# Patient Record
Sex: Female | Born: 1950
Health system: Southern US, Community
[De-identification: ages and names within clinical notes are randomized; demographics above are authoritative.]

## PROBLEM LIST (undated history)

## (undated) DIAGNOSIS — I10 Essential (primary) hypertension: Secondary | ICD-10-CM

## (undated) DIAGNOSIS — C7490 Malignant neoplasm of unspecified part of unspecified adrenal gland: Secondary | ICD-10-CM

## (undated) DIAGNOSIS — C801 Malignant (primary) neoplasm, unspecified: Secondary | ICD-10-CM

## (undated) HISTORY — PX: CERVICAL POLYPECTOMY: SHX88

## (undated) HISTORY — PX: ADRENAL GLAND SURGERY: SHX544

## (undated) HISTORY — PX: KNEE ARTHROSCOPY: SHX127

## (undated) HISTORY — PX: DILATION AND CURETTAGE OF UTERUS: SHX78

---

## 1999-01-03 ENCOUNTER — Other Ambulatory Visit: Admission: RE | Admit: 1999-01-03 | Discharge: 1999-01-03 | Payer: Self-pay | Admitting: Obstetrics & Gynecology

## 1999-04-10 ENCOUNTER — Encounter: Payer: Self-pay | Admitting: Obstetrics & Gynecology

## 1999-04-10 ENCOUNTER — Encounter: Admission: RE | Admit: 1999-04-10 | Discharge: 1999-04-10 | Payer: Self-pay | Admitting: Obstetrics & Gynecology

## 2000-01-10 ENCOUNTER — Other Ambulatory Visit: Admission: RE | Admit: 2000-01-10 | Discharge: 2000-01-10 | Payer: Self-pay | Admitting: Obstetrics & Gynecology

## 2000-04-10 ENCOUNTER — Encounter: Admission: RE | Admit: 2000-04-10 | Discharge: 2000-04-10 | Payer: Self-pay | Admitting: Obstetrics & Gynecology

## 2000-04-10 ENCOUNTER — Encounter: Payer: Self-pay | Admitting: Obstetrics & Gynecology

## 2001-02-06 ENCOUNTER — Other Ambulatory Visit: Admission: RE | Admit: 2001-02-06 | Discharge: 2001-02-06 | Payer: Self-pay | Admitting: Obstetrics & Gynecology

## 2001-02-11 ENCOUNTER — Encounter: Admission: RE | Admit: 2001-02-11 | Discharge: 2001-02-11 | Payer: Self-pay | Admitting: Family Medicine

## 2001-02-11 ENCOUNTER — Encounter: Payer: Self-pay | Admitting: Family Medicine

## 2001-02-12 ENCOUNTER — Encounter: Payer: Self-pay | Admitting: Family Medicine

## 2001-02-12 ENCOUNTER — Encounter: Admission: RE | Admit: 2001-02-12 | Discharge: 2001-02-12 | Payer: Self-pay | Admitting: Family Medicine

## 2001-02-20 ENCOUNTER — Encounter: Payer: Self-pay | Admitting: General Surgery

## 2001-02-20 ENCOUNTER — Ambulatory Visit (HOSPITAL_COMMUNITY): Admission: RE | Admit: 2001-02-20 | Discharge: 2001-02-20 | Payer: Self-pay | Admitting: General Surgery

## 2001-03-20 ENCOUNTER — Encounter: Payer: Self-pay | Admitting: General Surgery

## 2001-03-25 ENCOUNTER — Encounter: Payer: Self-pay | Admitting: General Surgery

## 2001-03-25 ENCOUNTER — Encounter (INDEPENDENT_AMBULATORY_CARE_PROVIDER_SITE_OTHER): Payer: Self-pay | Admitting: *Deleted

## 2001-03-25 ENCOUNTER — Inpatient Hospital Stay (HOSPITAL_COMMUNITY): Admission: RE | Admit: 2001-03-25 | Discharge: 2001-03-31 | Payer: Self-pay | Admitting: General Surgery

## 2001-04-08 ENCOUNTER — Encounter: Payer: Self-pay | Admitting: Oncology

## 2001-04-08 ENCOUNTER — Encounter: Admission: RE | Admit: 2001-04-08 | Discharge: 2001-04-08 | Payer: Self-pay | Admitting: Oncology

## 2001-05-27 ENCOUNTER — Encounter: Payer: Self-pay | Admitting: Obstetrics & Gynecology

## 2001-05-27 ENCOUNTER — Encounter: Admission: RE | Admit: 2001-05-27 | Discharge: 2001-05-27 | Payer: Self-pay | Admitting: Obstetrics & Gynecology

## 2001-06-24 ENCOUNTER — Encounter: Admission: RE | Admit: 2001-06-24 | Discharge: 2001-06-24 | Payer: Self-pay | Admitting: Oncology

## 2001-06-24 ENCOUNTER — Encounter: Payer: Self-pay | Admitting: Oncology

## 2001-09-28 ENCOUNTER — Encounter: Admission: RE | Admit: 2001-09-28 | Discharge: 2001-09-28 | Payer: Self-pay | Admitting: Oncology

## 2001-09-28 ENCOUNTER — Encounter: Payer: Self-pay | Admitting: Oncology

## 2001-12-21 ENCOUNTER — Encounter: Payer: Self-pay | Admitting: Oncology

## 2001-12-21 ENCOUNTER — Encounter: Admission: RE | Admit: 2001-12-21 | Discharge: 2001-12-21 | Payer: Self-pay | Admitting: Oncology

## 2002-02-09 ENCOUNTER — Other Ambulatory Visit: Admission: RE | Admit: 2002-02-09 | Discharge: 2002-02-09 | Payer: Self-pay | Admitting: Obstetrics & Gynecology

## 2002-04-27 ENCOUNTER — Encounter: Admission: RE | Admit: 2002-04-27 | Discharge: 2002-04-27 | Payer: Self-pay | Admitting: Oncology

## 2002-04-27 ENCOUNTER — Encounter: Payer: Self-pay | Admitting: Oncology

## 2002-05-31 ENCOUNTER — Ambulatory Visit (HOSPITAL_COMMUNITY): Admission: RE | Admit: 2002-05-31 | Discharge: 2002-05-31 | Payer: Self-pay | Admitting: Obstetrics & Gynecology

## 2002-05-31 ENCOUNTER — Encounter: Payer: Self-pay | Admitting: Obstetrics & Gynecology

## 2003-01-04 ENCOUNTER — Encounter: Admission: RE | Admit: 2003-01-04 | Discharge: 2003-01-04 | Payer: Self-pay | Admitting: Oncology

## 2003-02-17 ENCOUNTER — Other Ambulatory Visit: Admission: RE | Admit: 2003-02-17 | Discharge: 2003-02-17 | Payer: Self-pay | Admitting: Obstetrics & Gynecology

## 2003-06-01 ENCOUNTER — Ambulatory Visit (HOSPITAL_COMMUNITY): Admission: RE | Admit: 2003-06-01 | Discharge: 2003-06-01 | Payer: Self-pay | Admitting: Obstetrics & Gynecology

## 2003-09-09 ENCOUNTER — Ambulatory Visit (HOSPITAL_COMMUNITY): Admission: RE | Admit: 2003-09-09 | Discharge: 2003-09-09 | Payer: Self-pay | Admitting: Gastroenterology

## 2003-09-28 ENCOUNTER — Encounter: Admission: RE | Admit: 2003-09-28 | Discharge: 2003-09-28 | Payer: Self-pay | Admitting: Oncology

## 2004-02-14 ENCOUNTER — Ambulatory Visit: Payer: Self-pay | Admitting: Oncology

## 2004-02-20 ENCOUNTER — Other Ambulatory Visit: Admission: RE | Admit: 2004-02-20 | Discharge: 2004-02-20 | Payer: Self-pay | Admitting: Obstetrics & Gynecology

## 2004-06-06 ENCOUNTER — Ambulatory Visit (HOSPITAL_COMMUNITY): Admission: RE | Admit: 2004-06-06 | Discharge: 2004-06-06 | Payer: Self-pay | Admitting: Obstetrics & Gynecology

## 2004-10-05 ENCOUNTER — Encounter: Admission: RE | Admit: 2004-10-05 | Discharge: 2004-10-05 | Payer: Self-pay | Admitting: Family Medicine

## 2005-02-22 ENCOUNTER — Other Ambulatory Visit: Admission: RE | Admit: 2005-02-22 | Discharge: 2005-02-22 | Payer: Self-pay | Admitting: Obstetrics & Gynecology

## 2005-06-07 ENCOUNTER — Ambulatory Visit (HOSPITAL_COMMUNITY): Admission: RE | Admit: 2005-06-07 | Discharge: 2005-06-07 | Payer: Self-pay | Admitting: Obstetrics & Gynecology

## 2005-10-07 ENCOUNTER — Encounter: Admission: RE | Admit: 2005-10-07 | Discharge: 2005-10-07 | Payer: Self-pay | Admitting: Family Medicine

## 2006-06-09 ENCOUNTER — Ambulatory Visit (HOSPITAL_COMMUNITY): Admission: RE | Admit: 2006-06-09 | Discharge: 2006-06-09 | Payer: Self-pay | Admitting: *Deleted

## 2007-06-10 ENCOUNTER — Ambulatory Visit (HOSPITAL_COMMUNITY): Admission: RE | Admit: 2007-06-10 | Discharge: 2007-06-10 | Payer: Self-pay | Admitting: Family Medicine

## 2007-10-01 ENCOUNTER — Ambulatory Visit (HOSPITAL_COMMUNITY): Admission: RE | Admit: 2007-10-01 | Discharge: 2007-10-01 | Payer: Self-pay | Admitting: Family Medicine

## 2008-06-10 ENCOUNTER — Ambulatory Visit (HOSPITAL_COMMUNITY): Admission: RE | Admit: 2008-06-10 | Discharge: 2008-06-10 | Payer: Self-pay | Admitting: Obstetrics & Gynecology

## 2008-10-05 ENCOUNTER — Ambulatory Visit (HOSPITAL_COMMUNITY): Admission: RE | Admit: 2008-10-05 | Discharge: 2008-10-05 | Payer: Self-pay | Admitting: Family Medicine

## 2008-12-20 ENCOUNTER — Ambulatory Visit (HOSPITAL_COMMUNITY): Admission: RE | Admit: 2008-12-20 | Discharge: 2008-12-20 | Payer: Self-pay | Admitting: Gastroenterology

## 2009-06-12 ENCOUNTER — Ambulatory Visit (HOSPITAL_COMMUNITY): Admission: RE | Admit: 2009-06-12 | Discharge: 2009-06-12 | Payer: Self-pay | Admitting: Family Medicine

## 2009-09-05 ENCOUNTER — Encounter: Admission: RE | Admit: 2009-09-05 | Discharge: 2009-09-05 | Payer: Self-pay | Admitting: Family Medicine

## 2009-10-26 ENCOUNTER — Ambulatory Visit (HOSPITAL_COMMUNITY): Admission: RE | Admit: 2009-10-26 | Discharge: 2009-10-26 | Payer: Self-pay | Admitting: Specialist

## 2009-10-31 ENCOUNTER — Encounter
Admission: RE | Admit: 2009-10-31 | Discharge: 2009-11-23 | Payer: Self-pay | Source: Home / Self Care | Admitting: Specialist

## 2010-04-05 LAB — APTT: aPTT: 31 seconds (ref 24–37)

## 2010-04-05 LAB — COMPREHENSIVE METABOLIC PANEL
ALT: 19 U/L (ref 0–35)
Calcium: 10.3 mg/dL (ref 8.4–10.5)
Creatinine, Ser: 0.82 mg/dL (ref 0.4–1.2)
Glucose, Bld: 108 mg/dL — ABNORMAL HIGH (ref 70–99)
Sodium: 139 mEq/L (ref 135–145)
Total Protein: 7.1 g/dL (ref 6.0–8.3)

## 2010-04-05 LAB — URINE MICROSCOPIC-ADD ON

## 2010-04-05 LAB — CBC
HCT: 40.5 % (ref 36.0–46.0)
MCHC: 33.8 g/dL (ref 30.0–36.0)
RDW: 12.9 % (ref 11.5–15.5)

## 2010-04-05 LAB — URINALYSIS, ROUTINE W REFLEX MICROSCOPIC
Nitrite: NEGATIVE
Protein, ur: NEGATIVE mg/dL
Specific Gravity, Urine: 1.009 (ref 1.005–1.030)
Urobilinogen, UA: 0.2 mg/dL (ref 0.0–1.0)

## 2010-04-05 LAB — DIFFERENTIAL
Eosinophils Absolute: 0.1 10*3/uL (ref 0.0–0.7)
Lymphocytes Relative: 20 % (ref 12–46)
Lymphs Abs: 1.2 10*3/uL (ref 0.7–4.0)
Monocytes Relative: 8 % (ref 3–12)
Neutro Abs: 4.4 10*3/uL (ref 1.7–7.7)
Neutrophils Relative %: 71 % (ref 43–77)

## 2010-04-05 LAB — PROTIME-INR: INR: 0.97 (ref 0.00–1.49)

## 2010-05-10 ENCOUNTER — Other Ambulatory Visit (HOSPITAL_COMMUNITY): Payer: Self-pay | Admitting: Obstetrics & Gynecology

## 2010-05-10 DIAGNOSIS — Z1231 Encounter for screening mammogram for malignant neoplasm of breast: Secondary | ICD-10-CM

## 2010-06-08 NOTE — Op Note (Signed)
[place]. [facility]  Patient:    Robin Wells, Robin Wells Visit Number: [id number] MRN: [id number]          Service Type: SUR Location: MICU 2104 01 Attending Physician:  Brandy Hale Dictated by:   Angelia Mould. Derrell Lolling, M.D. Proc. Date: 03/25/01 Admit Date:  03/25/2001   CC:         [name]. [name], M.D.  [name]. [name], M.D.   Operative Report  PREOPERATIVE DIAGNOSIS:  Left adrenal mass, suspect adrenocortical carcinoma.  POSTOPERATIVE DIAGNOSIS:  Left adrenal mass, suspect adrenocortical carcinoma.  PROCEDURES: 1. Left adrenalectomy. 2. Repair left renal vein. 2. Take down splenic flexure of colon.  SURGEON:  Angelia Mould. Derrell Lolling, M.D.  FIRST ASSISTANT:  Velora Heckler, M.D.  ESTIMATED BLOOD LOSS:  About 1300-1500 cc.  FLUID REPLACEMENT:  One unit of packed red blood cells and 4000 cc of IV fluids.  OPERATIVE INDICATION:  This is a 60 year old white female who recently had an ultrasound, CT, and MRI which revealed a 15 cm mass in the left adrenal gland. There was concern that there may be tumor thrombus in the left adrenal vein. She has had some increased facial hair and abdominal hair which has turned darker over the past eight months.  She otherwise feels well.  Workup reveals that the tumor is secreting 17-ketosteroids and DHEA.  Workup for pheochromocytoma was negative.  She has mild hypertension, which has come under control with the use of Altace and hydrochlorothiazide.  She has been advised that this is most likely an adrenocortical carcinoma.  She is brought to the operating room electively for left adrenalectomy.  OPERATIVE FINDINGS:  The patient had a large tumor completely replacing her left adrenal gland.  This was at least 15 cm in size and perhaps larger.  It had numerous venous channels and a venous plexus throughout the surface of the gland, but this did not appear to be invading the diaphragm or the pancreas or the  spleen or the colon or the kidney.  There was no sign of any significant adenopathy.  There was no sign of any metastasis to the omentum or liver.  The tumor itself appeared to fill the left adrenal vein all the way down to the insertion of the left adrenal vein onto the left renal vein, which necessitated a complex vascular repair of the left renal vein and a venorrhaphy.  The left kidney was not involved.  There was a good tissue plane between the tumor and the left kidney, and we did not have to remove the left kidney.  DESCRIPTION OF PROCEDURE:  Following the induction of general endotracheal anesthesia, a Foley catheter was inserted.  An arterial line and a central venous catheter were inserted by anesthesia.  The abdomen was prepped and draped in a sterile fashion.  Bilateral subcostal incision was made, mostly on the left.  The abdominal wall was traversed with electrocautery and entered and explored with findings as described above.  A self-retaining retractor was placed.  We divided the gastrocolic omentum and mobilized the splenic flexure of the colon inferiorly and medially.  We identified the lower border of the pancreas.  We mobilized the spleen by dividing its lateral peritoneal attachments and then slowly with blunt dissection mobilized the spleen and the tail of the pancreas off the tumor.  The splenic vein was patent.  There was a good free plane between the posterior wall of the pancreas and the splenic vein and the tumor  itself, and we mobilized this far over to the right.  We then mobilized the left lobe of the liver by dividing the triangular ligament and then packed everything off to the right.  This then gave [place] exposure to the large left adrenal mass.  We started by dividing soft tissue between the tumor and the upper pole of the kidney and then mobilized the tumor laterally and superiorly and then superomedially.  Larger veins were controlled with metal clips or  2-0 silk ties.  We spent a great deal of time doing this.  This was a very difficult case because of the size of the tumor and the number of blood vessels we had to control, and it took [place] almost 3-1/2 hours to do the case because of the complexity of this work.  We continued to mobilize the tumor laterally and superiorly until we had it completely free from the diaphragm.  We could identify the aorta.  We identified some small arterial vessels coming from the aorta to the tumor, clipped these with metal clips and divided these.  As we continued to mobilize the tumor, we found one accessory adrenal vein, which was doubly ligated and divided.  We initially thought this was the adrenal vein but as we continued our dissection down toward the hilum of the kidney, we identified a tongue of the tumor, which we ultimately found was going right to the superior surface of the left renal vein.  After further isolating this and dissecting this, we felt that this was tumor completely filling the left adrenal vein.  We took this dissection all the way down to the renal vein, and the tumor stopped just above the renal vein.  We placed a Satinsky clamp as a side bite on the superior part of the left renal vein.  We transected the left adrenal vein at this point below the tumor and removed the specimen.  The left renal vein was then closed with a running locking suture of 4-0 Prolene.  The Satinsky clamp was removed and hemostasis was excellent, and the renal vein was patent and not bleeding.  We then irrigated the left upper quadrant copiously.  A few other small bleeders were controlled with 2-0 silk ties and Surgicel gauze, but ultimately we had a very dry field.  The left kidney looked fine, good pulsations in the left renal artery.  The aorta looked fine.  The diaphragm was inspected and found to be intact.  No injury to the diaphragm could be detected.  We removed  all of our packs.  We then  inspected the stomach and the pancreas and the spleen and the colon, and they all looked perfectly fine.  There was no apparent injury or laceration or bleeding to any of these organs.  We placed two 19 French round Jackson-Pratt drains in the left upper quadrant and brought these out through separate stab incisions in the left flank, sutured the drains to the skin with nylon sutures, and connected these to suction bulbs.  The posterior rectus sheath on each side of the midline was closed with running sutures of #1 PDS.  The anterior rectus sheath on each side of the midline was closed with running sutures of #1 PDS.  The skin was closed with skin staples.  Clean bandages were placed and the patient taken to the recovery room in stable condition.  She remained stable throughout the case with good urine output. Dictated by:   Angelia Mould. Derrell Lolling, M.D. Attending  Physician:  Brandy Hale DD:  03/25/01 TD:  03/26/01 Job: [id number]

## 2010-06-08 NOTE — Procedures (Signed)
[place]. [facility]  Patient:    Robin Wells, PE Visit Number: [id number] MRN: [id number]          Service Type: SUR Location: MICU 2104 01 Attending Physician:  Brandy Hale Dictated by:   Cliffton Asters Ivin Booty, M.D. Admit Date:  03/25/2001                             Procedure Report  PROCEDURE:  Insertion of epidural catheter for postoperative analgesia.  DESCRIPTION OF PROCEDURE:  Ms. Mousseau was brought to the operating room by [facility]. Derrell Lolling, M.D., for excision of an adrenal mass.  Prior to the operation I spoke with her concerning the risks and benefits of epidural analgesia, including those of infection, subarachnoid or intravenous injection, and nerve damage.  She understood these and wished to have the procedure performed.  At conclusion of the abdominal procedure, she was turned to the right lateral position.  Her back was prepped in a sterile fashion.  At the T9-10 interspace a 17-gauge Tuohy needle was passed to a loss of resistance to air, 3 cc. There was a negative aspiration of blood or CSF, and an 18-gauge epidural catheter was passed through the needle to a depth of 4 cm below the end of the needle.  The needle was removed, and the catheter was secured.  There was a negative aspiration of blood or CSF in the catheter, and a bolus injection of 14 cc of 0.5% lidocaine containing 75 mcg of fentanyl was given.  The patient was turned supine, suctioned, and extubated uneventfully in the operating room.  She was taken to the PACU in good condition.  She will be started there on a constant infusion through the catheter of 1/16% bupivacaine containing 5 mcg/cc of fentanyl.  This will be followed closely by the anesthesia department for several days until removal of the catheter. Dictated by:   Cliffton Asters Ivin Booty, M.D. Attending Physician:  Brandy Hale DD:  03/25/01 TD:  03/26/01 Job: [id number]

## 2010-06-08 NOTE — Discharge Summary (Signed)
[place]. [facility]  Patient:    Robin Wells, Robin Wells Visit Number: [id number] MRN: [id number]          Service Type: SUR Location: [id number] Attending Physician:  Robin Wells Dictated by:   Angelia Wells. Robin Wells, M.D. Admit Date:  03/25/2001 Discharge Date: 03/31/2001   CC:         Robin Wells. Robin Wells, M.D.  Robin Wells. Robin Wells, M.D.  Robin Wells, M.D.   Discharge Summary  FINAL DIAGNOSES: 1. Cortical carcinoma of the left adrenal gland. 2. Hypertension. 3. Hyperlipidemia.  OPERATION:  Left adrenalectomy and repair of left renal vein with takedown of splenic flexure and colon on March 25, 2001.  HISTORY:  This is a 60 year old white female who was found to have a large left adrenal mass by Dr. Henrine Wells.  Ultrasound and CT scan and MRI were ultimately done.  These showed a large tumor at least 11 cm in diameter in the left upper quadrant on top of the left kidney, displacing the left kidney inferiorly.  The splenic vein was noted to be patent.  The left renal vein was noted to be patent as was the inferior vena cava.  There were no signs of any metastatic disease or other tumor within the abdomen.  The MRI measured the mass as 15 cm in diameter, and it was felt to be most likely an adrenal cortical carcinoma.  Endocrine workup revealed that the tumor was secreting 17 ketosteroids and DHEA at a high level.  The urine free cortisol was 8.6 which is slightly low, suggesting the possibility of mild suppression of the contralateral adrenal. Metanephrines, normetanephrines, and VMA were normal, and so we did not feel this was a pheochromocytoma.  Blood pressure was controlled preoperatively with Altace and hydrochlorothiazide.  Elective left adrenalectomy was advised, and she was brought to the hospital for that electively.  For details of Past Medical History, Social History, Family History, please see detailed admission  note.  PHYSICAL EXAMINATION:  GENERAL:  A pleasant, mildly anxious, slightly overweight white female in no distress.  HEENT:  Sclerae clear.  NECK:  Without mass.  LUNGS:  Clear.  HEART:  Regular rate and rhythm, no murmur.  ABDOMEN:  Obese, soft, nontender.  No palpable mass, no bruit.  GROIN:  No femoral pulses, no inguinal adenopathy.  EXTREMITIES:  No edema, good pulses.  SKIN:  It was noted that she did have some hirsutism with black hair on the face and abdominal wall which had been known for the past year.  HOSPITAL COURSE:  On the day of admission, the patient was taken to the operating room and underwent exploratory laparotomy, left adrenalectomy, and repair of the left renal vein.  This was a large circumscribed tumor in the left upper quadrant requiring complete mobilization of the spleen, pancreas, left colon, splenic flexure, and stomach all the way past the midline.  The tumor appeared to fill the left adrenal vein all the way down close to the renal vein, and so the renal vein had to be controlled with a Satinsky clamp, amputating the tumor with this, and the renal vein was suture closed with a running suture of Prolene.  There was no sign of any other pathologic process.  The patient felt no intraoperative complications.  Postoperatively, the patient did well clinically.  She progressed slowly but steadily in her diet and activities.  We left a Jackson-Pratt drain in place until she was eating.  The drain  was then removed.  She was maintained on Decadron which was rapidly tapered.  Her pathology report shows adrenal cortical carcinoma, 16 cm in diameter, margins negative.  No lymph nodes were identified.  The patient was advised of her diagnosis and its implications.  She was advised to be seen by a medical oncologist, and that is going to be arranged as an outpatient.  She is discharged on March 31, 2001.  At that time, she was tolerating diet, had had  bowel movements, was ambulatory.  He wounds were doing well, and she felt well enough to go home.  She was given a prescription for prednisone 10 mg a day with a rapid taper over the next 12 days.  She was given a prescription for Vicodin.  She was to continue hydrochlorothiazide and Altace.  She was advised to return to see me in the office in six to eight days.  She was advised to return to see Robin Wells or Dr. Henrine Wells for followup on her hypertension.  She was scheduled to see Dr. Jama Wells as an outpatient for medical oncology consultation.  Dictated by:   Angelia Wells. Robin Wells, M.D. Attending Physician:  Robin Wells DD:  04/17/01 TD:  04/18/01 Job: [phone] [id number]

## 2010-06-14 ENCOUNTER — Ambulatory Visit (HOSPITAL_COMMUNITY)
Admission: RE | Admit: 2010-06-14 | Discharge: 2010-06-14 | Disposition: A | Discharge status: Home / Self Care | Payer: 59 | Source: Ambulatory Visit | Attending: Obstetrics & Gynecology | Admitting: Obstetrics & Gynecology

## 2010-06-14 DIAGNOSIS — Z1231 Encounter for screening mammogram for malignant neoplasm of breast: Secondary | ICD-10-CM | POA: Insufficient documentation

## 2011-04-24 ENCOUNTER — Other Ambulatory Visit: Payer: Self-pay | Admitting: Obstetrics & Gynecology

## 2011-04-24 DIAGNOSIS — Z1231 Encounter for screening mammogram for malignant neoplasm of breast: Secondary | ICD-10-CM

## 2011-06-18 ENCOUNTER — Other Ambulatory Visit (HOSPITAL_COMMUNITY): Payer: Self-pay | Admitting: Family Medicine

## 2011-06-18 ENCOUNTER — Ambulatory Visit (HOSPITAL_COMMUNITY)
Admission: RE | Admit: 2011-06-18 | Discharge: 2011-06-18 | Disposition: A | Discharge status: Home / Self Care | Payer: 59 | Source: Ambulatory Visit | Attending: Obstetrics & Gynecology | Admitting: Obstetrics & Gynecology

## 2011-06-18 DIAGNOSIS — Z1231 Encounter for screening mammogram for malignant neoplasm of breast: Secondary | ICD-10-CM | POA: Insufficient documentation

## 2012-03-19 ENCOUNTER — Other Ambulatory Visit: Payer: Self-pay | Admitting: Obstetrics & Gynecology

## 2012-04-24 ENCOUNTER — Other Ambulatory Visit: Payer: Self-pay | Admitting: Obstetrics & Gynecology

## 2012-04-24 DIAGNOSIS — Z1231 Encounter for screening mammogram for malignant neoplasm of breast: Secondary | ICD-10-CM

## 2012-06-24 ENCOUNTER — Ambulatory Visit (HOSPITAL_COMMUNITY)
Admission: RE | Admit: 2012-06-24 | Discharge: 2012-06-24 | Disposition: A | Discharge status: Home / Self Care | Payer: 59 | Source: Ambulatory Visit | Attending: Obstetrics & Gynecology | Admitting: Obstetrics & Gynecology

## 2012-06-24 DIAGNOSIS — Z1231 Encounter for screening mammogram for malignant neoplasm of breast: Secondary | ICD-10-CM | POA: Insufficient documentation

## 2013-05-28 ENCOUNTER — Other Ambulatory Visit (HOSPITAL_COMMUNITY): Payer: Self-pay | Admitting: Obstetrics & Gynecology

## 2013-05-28 DIAGNOSIS — Z1231 Encounter for screening mammogram for malignant neoplasm of breast: Secondary | ICD-10-CM

## 2013-07-01 ENCOUNTER — Ambulatory Visit (HOSPITAL_COMMUNITY)
Admission: RE | Admit: 2013-07-01 | Discharge: 2013-07-01 | Disposition: A | Discharge status: Home / Self Care | Payer: 59 | Source: Ambulatory Visit | Attending: Obstetrics & Gynecology | Admitting: Obstetrics & Gynecology

## 2013-07-01 DIAGNOSIS — Z1231 Encounter for screening mammogram for malignant neoplasm of breast: Secondary | ICD-10-CM | POA: Insufficient documentation

## 2014-06-30 ENCOUNTER — Other Ambulatory Visit: Payer: Self-pay | Admitting: Obstetrics & Gynecology

## 2014-07-01 LAB — CYTOLOGY - PAP

## 2014-08-02 ENCOUNTER — Other Ambulatory Visit (HOSPITAL_COMMUNITY): Payer: Self-pay | Admitting: Obstetrics & Gynecology

## 2014-08-02 ENCOUNTER — Ambulatory Visit (HOSPITAL_COMMUNITY)
Admission: RE | Admit: 2014-08-02 | Discharge: 2014-08-02 | Disposition: A | Discharge status: Home / Self Care | Payer: 59 | Source: Ambulatory Visit | Attending: Obstetrics & Gynecology | Admitting: Obstetrics & Gynecology

## 2014-08-02 DIAGNOSIS — Z1231 Encounter for screening mammogram for malignant neoplasm of breast: Secondary | ICD-10-CM

## 2014-08-31 ENCOUNTER — Encounter (HOSPITAL_COMMUNITY): Payer: Self-pay

## 2014-08-31 ENCOUNTER — Other Ambulatory Visit: Payer: Self-pay

## 2014-08-31 ENCOUNTER — Encounter (HOSPITAL_COMMUNITY)
Admission: RE | Admit: 2014-08-31 | Discharge: 2014-08-31 | Disposition: A | Discharge status: Home / Self Care | Payer: 59 | Source: Ambulatory Visit | Attending: Obstetrics and Gynecology | Admitting: Obstetrics and Gynecology

## 2014-08-31 DIAGNOSIS — Z01810 Encounter for preprocedural cardiovascular examination: Secondary | ICD-10-CM | POA: Diagnosis not present

## 2014-08-31 DIAGNOSIS — Z01818 Encounter for other preprocedural examination: Secondary | ICD-10-CM | POA: Insufficient documentation

## 2014-08-31 DIAGNOSIS — I10 Essential (primary) hypertension: Secondary | ICD-10-CM | POA: Insufficient documentation

## 2014-08-31 HISTORY — DX: Essential (primary) hypertension: I10

## 2014-08-31 HISTORY — DX: Malignant (primary) neoplasm, unspecified: C80.1

## 2014-08-31 LAB — COMPREHENSIVE METABOLIC PANEL
ALT: 16 U/L (ref 14–54)
AST: 18 U/L (ref 15–41)
Albumin: 4.4 g/dL (ref 3.5–5.0)
Alkaline Phosphatase: 71 U/L (ref 38–126)
Anion gap: 5 (ref 5–15)
BUN: 16 mg/dL (ref 6–20)
CALCIUM: 10.4 mg/dL — AB (ref 8.9–10.3)
CO2: 30 mmol/L (ref 22–32)
CREATININE: 0.89 mg/dL (ref 0.44–1.00)
Chloride: 104 mmol/L (ref 101–111)
GFR calc non Af Amer: >60 mL/min (ref >60)
Glucose, Bld: 107 mg/dL — ABNORMAL HIGH (ref 65–99)
Potassium: 4 mmol/L (ref 3.5–5.1)
SODIUM: 139 mmol/L (ref 135–145)
Total Bilirubin: 0.7 mg/dL (ref 0.3–1.2)
Total Protein: 7.1 g/dL (ref 6.5–8.1)

## 2014-08-31 LAB — ABO/RH: ABO/RH(D): AB NEG

## 2014-08-31 LAB — TYPE AND SCREEN
ABO/RH(D): AB NEG
Antibody Screen: NEGATIVE

## 2014-08-31 LAB — CBC
HCT: 37.6 % (ref 36.0–46.0)
Hemoglobin: 12.8 g/dL (ref 12.0–15.0)
MCH: 30.3 pg (ref 26.0–34.0)
MCHC: 34 g/dL (ref 30.0–36.0)
MCV: 89.1 fL (ref 78.0–100.0)
Platelets: 189 10*3/uL (ref 150–400)
RBC: 4.22 MIL/uL (ref 3.87–5.11)
RDW: 12.9 % (ref 11.5–15.5)
WBC: 6.7 10*3/uL (ref 4.0–10.5)

## 2014-08-31 NOTE — Anesthesia Preprocedure Evaluation (Addendum)
Anesthesia Evaluation  Patient identified by MRN, date of birth, ID band Patient awake    Reviewed: Allergy & Precautions, H&P , Patient's Chart, lab work & pertinent test results, reviewed documented beta blocker date and time   Airway Mallampati: II TM Distance: >3 FB Neck ROM: full    Dental no notable dental hx.    Pulmonary  breath sounds clear to auscultation  Pulmonary exam normal       Cardiovascular hypertension, Rhythm:regular Rate:Normal     Neuro/Psych    GI/Hepatic   Endo/Other    Renal/GU      Musculoskeletal   Abdominal   Peds  Hematology   Anesthesia Other Findings   Reproductive/Obstetrics                           Anesthesia Physical Anesthesia Plan  ASA: II  Anesthesia Plan: General   Post-op Pain Management:    Induction: Intravenous  Airway Management Planned: Oral ETT  Additional Equipment:   Intra-op Plan:   Post-operative Plan: Extubation in OR  Informed Consent: I have reviewed the patients History and Physical, chart, labs and discussed the procedure including the risks, benefits and alternatives for the proposed anesthesia with the patient or authorized representative who has indicated his/her understanding and acceptance.   Dental Advisory Given and Dental advisory given  Plan Discussed with: CRNA and Surgeon  Anesthesia Plan Comments: (  Discussed general anesthesia, including possible nausea, instrumentation of airway, sore throat,pulmonary aspiration, etc. I asked if the were any outstanding questions, or  concerns before we proceeded. )        Anesthesia Quick Evaluation  

## 2014-08-31 NOTE — Patient Instructions (Signed)
Your procedure is scheduled on:09/08/14  Enter through the Main Entrance at :6am Pick up desk phone and dial [phone] and inform us of your arrival.  Please call [phone] if you have any problems the morning of surgery.  Remember: Do not eat food or drink liquids, including water, after midnight:WED    You may brush your teeth the morning of surgery.  DO NOT wear jewelry, eye make-up, lipstick,body lotion, or dark fingernail polish.  (Polished toes are ok) You may wear deodorant.  If you are to be admitted after surgery, leave suitcase in car until your room has been assigned. Patients discharged on the day of surgery will not be allowed to drive home. Wear loose fitting, comfortable clothes for your ride home.

## 2014-09-08 ENCOUNTER — Inpatient Hospital Stay (HOSPITAL_COMMUNITY)
Admission: AD | Admit: 2014-09-08 | Discharge: 2014-09-09 | DRG: 743 | Disposition: A | Discharge status: Home / Self Care | Payer: 59 | Source: Ambulatory Visit | Attending: Obstetrics and Gynecology | Admitting: Obstetrics and Gynecology

## 2014-09-08 ENCOUNTER — Ambulatory Visit (HOSPITAL_COMMUNITY): Payer: 59 | Admitting: Anesthesiology

## 2014-09-08 ENCOUNTER — Encounter (HOSPITAL_COMMUNITY)
Admission: AD | Disposition: A | Discharge status: Home / Self Care | Payer: Self-pay | Source: Ambulatory Visit | Attending: Obstetrics and Gynecology

## 2014-09-08 DIAGNOSIS — N813 Complete uterovaginal prolapse: Secondary | ICD-10-CM | POA: Diagnosis present

## 2014-09-08 DIAGNOSIS — Z882 Allergy status to sulfonamides status: Secondary | ICD-10-CM | POA: Diagnosis not present

## 2014-09-08 DIAGNOSIS — Z8589 Personal history of malignant neoplasm of other organs and systems: Secondary | ICD-10-CM

## 2014-09-08 DIAGNOSIS — Z79899 Other long term (current) drug therapy: Secondary | ICD-10-CM | POA: Diagnosis not present

## 2014-09-08 DIAGNOSIS — Z881 Allergy status to other antibiotic agents status: Secondary | ICD-10-CM

## 2014-09-08 DIAGNOSIS — I10 Essential (primary) hypertension: Secondary | ICD-10-CM | POA: Diagnosis present

## 2014-09-08 DIAGNOSIS — Z880 Allergy status to penicillin: Secondary | ICD-10-CM

## 2014-09-08 DIAGNOSIS — N393 Stress incontinence (female) (male): Secondary | ICD-10-CM | POA: Diagnosis present

## 2014-09-08 HISTORY — PX: CYSTOSCOPY: SHX5120

## 2014-09-08 HISTORY — PX: ANTERIOR AND POSTERIOR REPAIR WITH SACROSPINOUS FIXATION: SHX6536

## 2014-09-08 HISTORY — PX: PUBOVAGINAL SLING: SHX1035

## 2014-09-08 HISTORY — PX: LAPAROSCOPIC ASSISTED VAGINAL HYSTERECTOMY: SHX5398

## 2014-09-08 HISTORY — PX: SALPINGOOPHORECTOMY: SHX82

## 2014-09-08 SURGERY — HYSTERECTOMY, VAGINAL, LAPAROSCOPY-ASSISTED
Anesthesia: General

## 2014-09-08 MED ORDER — MAGNESIUM HYDROXIDE 400 MG/5ML PO SUSP: 30.0000 mL | Freq: Every day | ORAL | Status: DC | PRN

## 2014-09-08 MED ORDER — LISINOPRIL 10 MG PO TABS
10.0000 mg | ORAL_TABLET | Freq: Every day | ORAL | Status: DC
  Administered 2014-09-08: 10 mg via ORAL
  Filled 2014-09-08: qty 1

## 2014-09-08 MED ORDER — OXYCODONE-ACETAMINOPHEN 5-325 MG PO TABS: 1.0000 | ORAL_TABLET | ORAL | Status: DC | PRN

## 2014-09-08 MED ORDER — SODIUM CHLORIDE 0.9 % IJ SOLN
INTRAMUSCULAR | Status: DC | PRN
  Administered 2014-09-08: 10 mL via INTRAVENOUS

## 2014-09-08 MED ORDER — NALOXONE HCL 0.4 MG/ML IJ SOLN: 0.4000 mg | INTRAMUSCULAR | Status: DC | PRN

## 2014-09-08 MED ORDER — DIPHENHYDRAMINE HCL 50 MG/ML IJ SOLN: 12.5000 mg | Freq: Four times a day (QID) | INTRAMUSCULAR | Status: DC | PRN

## 2014-09-08 MED ORDER — SODIUM CHLORIDE 0.9 % IJ SOLN: 9.0000 mL | INTRAMUSCULAR | Status: DC | PRN

## 2014-09-08 MED ORDER — SCOPOLAMINE 1 MG/3DAYS TD PT72
MEDICATED_PATCH | TRANSDERMAL | Status: AC
  Administered 2014-09-08: 1.5 mg via TRANSDERMAL
  Filled 2014-09-08: qty 1

## 2014-09-08 MED ORDER — HYDROMORPHONE HCL 1 MG/ML IJ SOLN
INTRAMUSCULAR | Status: AC
  Filled 2014-09-08: qty 1

## 2014-09-08 MED ORDER — ONDANSETRON HCL 4 MG/2ML IJ SOLN
INTRAMUSCULAR | Status: AC
  Filled 2014-09-08: qty 2

## 2014-09-08 MED ORDER — FLUORESCEIN SODIUM 10 % IJ SOLN
INTRAMUSCULAR | Status: DC | PRN
  Administered 2014-09-08: .1 mL via INTRAVENOUS

## 2014-09-08 MED ORDER — LACTATED RINGERS IV SOLN
INTRAVENOUS | Status: DC
  Administered 2014-09-08 (×3): via INTRAVENOUS

## 2014-09-08 MED ORDER — ONDANSETRON HCL 4 MG/2ML IJ SOLN
INTRAMUSCULAR | Status: DC | PRN
  Administered 2014-09-08: 4 mg via INTRAVENOUS

## 2014-09-08 MED ORDER — LIDOCAINE HCL (CARDIAC) 20 MG/ML IV SOLN
INTRAVENOUS | Status: AC
  Filled 2014-09-08: qty 5

## 2014-09-08 MED ORDER — MIDAZOLAM HCL 2 MG/2ML IJ SOLN
INTRAMUSCULAR | Status: AC
  Filled 2014-09-08: qty 4

## 2014-09-08 MED ORDER — IBUPROFEN 600 MG PO TABS
600.0000 mg | ORAL_TABLET | Freq: Four times a day (QID) | ORAL | Status: DC | PRN
  Administered 2014-09-08 – 2014-09-09 (×2): 600 mg via ORAL
  Filled 2014-09-08 (×2): qty 1

## 2014-09-08 MED ORDER — DEXAMETHASONE SODIUM PHOSPHATE 10 MG/ML IJ SOLN
INTRAMUSCULAR | Status: AC
  Filled 2014-09-08: qty 1

## 2014-09-08 MED ORDER — FLUORESCEIN SODIUM 10 % IJ SOLN
500.0000 mg | Freq: Once | INTRAMUSCULAR | Status: DC
  Filled 2014-09-08: qty 5

## 2014-09-08 MED ORDER — LIDOCAINE HCL (CARDIAC) 20 MG/ML IV SOLN
INTRAVENOUS | Status: DC | PRN
  Administered 2014-09-08: 100 mg via INTRAVENOUS

## 2014-09-08 MED ORDER — LORATADINE 10 MG PO TABS
10.0000 mg | ORAL_TABLET | Freq: Every day | ORAL | Status: DC
  Filled 2014-09-08 (×2): qty 1

## 2014-09-08 MED ORDER — CEFAZOLIN SODIUM-DEXTROSE 2-3 GM-% IV SOLR
2.0000 g | INTRAVENOUS | Status: AC
  Administered 2014-09-08: 2 g via INTRAVENOUS

## 2014-09-08 MED ORDER — DEXAMETHASONE SODIUM PHOSPHATE 10 MG/ML IJ SOLN
INTRAMUSCULAR | Status: DC | PRN
  Administered 2014-09-08 (×2): 4 mg via INTRAVENOUS

## 2014-09-08 MED ORDER — ESTRADIOL 0.1 MG/GM VA CREA
TOPICAL_CREAM | VAGINAL | Status: DC | PRN
  Administered 2014-09-08: 1 via VAGINAL

## 2014-09-08 MED ORDER — SIMETHICONE 80 MG PO CHEW: 80.0000 mg | CHEWABLE_TABLET | Freq: Four times a day (QID) | ORAL | Status: DC | PRN

## 2014-09-08 MED ORDER — DEXAMETHASONE SODIUM PHOSPHATE 4 MG/ML IJ SOLN
INTRAMUSCULAR | Status: AC
  Filled 2014-09-08: qty 1

## 2014-09-08 MED ORDER — PROPOFOL 10 MG/ML IV BOLUS
INTRAVENOUS | Status: DC | PRN
  Administered 2014-09-08: 160 mg via INTRAVENOUS

## 2014-09-08 MED ORDER — HYDROMORPHONE 0.3 MG/ML IV SOLN: INTRAVENOUS | Status: DC

## 2014-09-08 MED ORDER — STERILE WATER FOR IRRIGATION IR SOLN
Status: DC | PRN
  Administered 2014-09-08: 1000 mL

## 2014-09-08 MED ORDER — HEPARIN SODIUM (PORCINE) 5000 UNIT/ML IJ SOLN
INTRAMUSCULAR | Status: AC
  Filled 2014-09-08: qty 1

## 2014-09-08 MED ORDER — NEOSTIGMINE METHYLSULFATE 10 MG/10ML IV SOLN
INTRAVENOUS | Status: DC | PRN
  Administered 2014-09-08: 3 mg via INTRAVENOUS

## 2014-09-08 MED ORDER — ONDANSETRON HCL 4 MG/2ML IJ SOLN: 4.0000 mg | Freq: Four times a day (QID) | INTRAMUSCULAR | Status: DC | PRN

## 2014-09-08 MED ORDER — LIDOCAINE-EPINEPHRINE 0.5 %-1:200000 IJ SOLN
INTRAMUSCULAR | Status: DC | PRN
  Administered 2014-09-08: 8 mL

## 2014-09-08 MED ORDER — SODIUM CHLORIDE 0.9 % IJ SOLN
INTRAMUSCULAR | Status: AC
  Filled 2014-09-08: qty 10

## 2014-09-08 MED ORDER — DIPHENHYDRAMINE HCL 12.5 MG/5ML PO ELIX
12.5000 mg | ORAL_SOLUTION | Freq: Four times a day (QID) | ORAL | Status: DC | PRN
  Filled 2014-09-08: qty 5

## 2014-09-08 MED ORDER — LISINOPRIL 10 MG PO TABS
10.0000 mg | ORAL_TABLET | Freq: Every day | ORAL | Status: DC
  Filled 2014-09-08 (×2): qty 1

## 2014-09-08 MED ORDER — LACTATED RINGERS IR SOLN
Status: DC | PRN
  Administered 2014-09-08: 3000 mL

## 2014-09-08 MED ORDER — KETOROLAC TROMETHAMINE 30 MG/ML IJ SOLN
INTRAMUSCULAR | Status: AC
  Filled 2014-09-08: qty 1

## 2014-09-08 MED ORDER — CEFAZOLIN SODIUM-DEXTROSE 2-3 GM-% IV SOLR
INTRAVENOUS | Status: AC
  Filled 2014-09-08: qty 50

## 2014-09-08 MED ORDER — PROPOFOL 10 MG/ML IV BOLUS
INTRAVENOUS | Status: AC
  Filled 2014-09-08: qty 20

## 2014-09-08 MED ORDER — FENTANYL CITRATE (PF) 100 MCG/2ML IJ SOLN: 25.0000 ug | INTRAMUSCULAR | Status: DC | PRN

## 2014-09-08 MED ORDER — GLYCOPYRROLATE 0.2 MG/ML IJ SOLN
INTRAMUSCULAR | Status: DC | PRN
  Administered 2014-09-08: 0.6 mg via INTRAVENOUS

## 2014-09-08 MED ORDER — ROCURONIUM BROMIDE 100 MG/10ML IV SOLN
INTRAVENOUS | Status: DC | PRN
  Administered 2014-09-08: 40 mg via INTRAVENOUS
  Administered 2014-09-08: 10 mg via INTRAVENOUS
  Administered 2014-09-08: 5 mg via INTRAVENOUS
  Administered 2014-09-08 (×2): 10 mg via INTRAVENOUS

## 2014-09-08 MED ORDER — ZOLPIDEM TARTRATE 5 MG PO TABS: 5.0000 mg | ORAL_TABLET | Freq: Every evening | ORAL | Status: DC | PRN

## 2014-09-08 MED ORDER — MIDAZOLAM HCL 5 MG/5ML IJ SOLN
INTRAMUSCULAR | Status: DC | PRN
  Administered 2014-09-08: 2 mg via INTRAVENOUS

## 2014-09-08 MED ORDER — NEOSTIGMINE METHYLSULFATE 10 MG/10ML IV SOLN
INTRAVENOUS | Status: AC
  Filled 2014-09-08: qty 1

## 2014-09-08 MED ORDER — BUPIVACAINE HCL (PF) 0.5 % IJ SOLN
INTRAMUSCULAR | Status: AC
  Filled 2014-09-08: qty 30

## 2014-09-08 MED ORDER — FENTANYL CITRATE (PF) 100 MCG/2ML IJ SOLN
INTRAMUSCULAR | Status: DC | PRN
  Administered 2014-09-08: 50 ug via INTRAVENOUS
  Administered 2014-09-08: 100 ug via INTRAVENOUS
  Administered 2014-09-08 (×2): 50 ug via INTRAVENOUS

## 2014-09-08 MED ORDER — ROCURONIUM BROMIDE 100 MG/10ML IV SOLN
INTRAVENOUS | Status: AC
  Filled 2014-09-08: qty 1

## 2014-09-08 MED ORDER — SENNA 8.6 MG PO TABS
1.0000 | ORAL_TABLET | Freq: Two times a day (BID) | ORAL | Status: DC
  Administered 2014-09-08 – 2014-09-09 (×2): 8.6 mg via ORAL
  Filled 2014-09-08 (×3): qty 1

## 2014-09-08 MED ORDER — LACTATED RINGERS IV SOLN
INTRAVENOUS | Status: DC
  Administered 2014-09-08 (×2): via INTRAVENOUS

## 2014-09-08 MED ORDER — BUPIVACAINE HCL (PF) 0.5 % IJ SOLN
INTRAMUSCULAR | Status: DC | PRN
  Administered 2014-09-08: 6 mL

## 2014-09-08 MED ORDER — LIDOCAINE-EPINEPHRINE 0.5 %-1:200000 IJ SOLN
INTRAMUSCULAR | Status: AC
  Filled 2014-09-08: qty 1

## 2014-09-08 MED ORDER — ACETAMINOPHEN 160 MG/5ML PO SOLN
650.0000 mg | Freq: Once | ORAL | Status: AC
  Administered 2014-09-08: 650 mg via ORAL

## 2014-09-08 MED ORDER — ACETAMINOPHEN 160 MG/5ML PO SOLN
ORAL | Status: AC
  Administered 2014-09-08: 650 mg via ORAL
  Filled 2014-09-08: qty 40.6

## 2014-09-08 MED ORDER — DEXAMETHASONE SODIUM PHOSPHATE 4 MG/ML IJ SOLN
INTRAMUSCULAR | Status: AC
  Filled 2014-09-08: qty 2

## 2014-09-08 MED ORDER — SODIUM CHLORIDE 0.9 % IJ SOLN
INTRAMUSCULAR | Status: AC
  Filled 2014-09-08: qty 100

## 2014-09-08 MED ORDER — FENTANYL CITRATE (PF) 250 MCG/5ML IJ SOLN
INTRAMUSCULAR | Status: AC
  Filled 2014-09-08: qty 25

## 2014-09-08 MED ORDER — ESTRADIOL 0.1 MG/GM VA CREA
TOPICAL_CREAM | VAGINAL | Status: AC
  Filled 2014-09-08: qty 42.5

## 2014-09-08 MED ORDER — ONDANSETRON HCL 4 MG PO TABS: 4.0000 mg | ORAL_TABLET | Freq: Four times a day (QID) | ORAL | Status: DC | PRN

## 2014-09-08 MED ORDER — HYDROMORPHONE HCL 1 MG/ML IJ SOLN
INTRAMUSCULAR | Status: DC | PRN
  Administered 2014-09-08 (×2): 0.5 mg via INTRAVENOUS

## 2014-09-08 MED ORDER — GLYCOPYRROLATE 0.2 MG/ML IJ SOLN
INTRAMUSCULAR | Status: AC
  Filled 2014-09-08: qty 4

## 2014-09-08 MED ORDER — SCOPOLAMINE 1 MG/3DAYS TD PT72
1.0000 | MEDICATED_PATCH | Freq: Once | TRANSDERMAL | Status: DC
  Administered 2014-09-08: 1.5 mg via TRANSDERMAL

## 2014-09-08 SURGICAL SUPPLY — 74 items
BLADE SURG 11 STRL SS (BLADE) ×4 IMPLANT
BLADE SURG 15 STRL LF C SS BP (BLADE) ×3 IMPLANT
BLADE SURG 15 STRL SS (BLADE) ×4
CABLE HIGH FREQUENCY MONO STRZ (ELECTRODE) IMPLANT
CATH ROBINSON RED A/P 16FR (CATHETERS) ×4 IMPLANT
CLOTH BEACON ORANGE TIMEOUT ST (SAFETY) ×4 IMPLANT
CONT PATH 16OZ SNAP LID 3702 (MISCELLANEOUS) ×4 IMPLANT
COVER BACK TABLE 60X90IN (DRAPES) ×4 IMPLANT
DECANTER SPIKE VIAL GLASS SM (MISCELLANEOUS) ×7 IMPLANT
DEVICE CAPIO SLIM SINGLE (INSTRUMENTS) ×4 IMPLANT
DEVICE CAPIO SUTURING (INSTRUMENTS) ×1
DEVICE CAPIO SUTURING OPC (INSTRUMENTS) IMPLANT
DISSECTOR SPONGE CHERRY (GAUZE/BANDAGES/DRESSINGS) IMPLANT
DRSG COVADERM PLUS 2X2 (GAUZE/BANDAGES/DRESSINGS) ×7 IMPLANT
DRSG OPSITE POSTOP 3X4 (GAUZE/BANDAGES/DRESSINGS) ×1 IMPLANT
DURAPREP 26ML APPLICATOR (WOUND CARE) ×4 IMPLANT
ELECT LIGASURE LONG (ELECTRODE) ×1 IMPLANT
ELECT REM PT RETURN 9FT ADLT (ELECTROSURGICAL) ×4
ELECTRODE REM PT RTRN 9FT ADLT (ELECTROSURGICAL) IMPLANT
FILTER SMOKE EVAC LAPAROSHD (FILTER) ×4 IMPLANT
GAUZE PACKING 1 X5 YD ST (GAUZE/BANDAGES/DRESSINGS) ×6 IMPLANT
GAUZE SPONGE 4X4 16PLY XRAY LF (GAUZE/BANDAGES/DRESSINGS) ×4 IMPLANT
GLOVE BIO SURGEON STRL SZ7.5 (GLOVE) ×8 IMPLANT
GLOVE BIOGEL PI IND STRL 6.5 (GLOVE) ×3 IMPLANT
GLOVE BIOGEL PI IND STRL 8 (GLOVE) ×3 IMPLANT
GLOVE BIOGEL PI INDICATOR 6.5 (GLOVE) ×1
GLOVE BIOGEL PI INDICATOR 8 (GLOVE) ×1
GOWN STRL REUS W/ TWL XL LVL3 (GOWN DISPOSABLE) ×6 IMPLANT
GOWN STRL REUS W/TWL LRG LVL3 (GOWN DISPOSABLE) ×17 IMPLANT
GOWN STRL REUS W/TWL XL LVL3 (GOWN DISPOSABLE) ×8
LIQUID BAND (GAUZE/BANDAGES/DRESSINGS) ×4 IMPLANT
MARKER SKIN DUAL TIP RULER LAB (MISCELLANEOUS) ×1 IMPLANT
NDL HYPO 25X5/8 SAFETYGLIDE (NEEDLE) ×3 IMPLANT
NDL SPNL 22GX3.5 QUINCKE BK (NEEDLE) IMPLANT
NEEDLE HYPO 22GX1.5 SAFETY (NEEDLE) IMPLANT
NEEDLE HYPO 25X5/8 SAFETYGLIDE (NEEDLE) ×4 IMPLANT
NEEDLE INSUFFLATION 120MM (ENDOMECHANICALS) ×4 IMPLANT
NEEDLE MAYO .5 CIRCLE (NEEDLE) ×4 IMPLANT
NEEDLE SPNL 22GX3.5 QUINCKE BK (NEEDLE) ×4 IMPLANT
NS IRRIG 1000ML POUR BTL (IV SOLUTION) ×4 IMPLANT
PACK LAVH (CUSTOM PROCEDURE TRAY) ×4 IMPLANT
PACK ROBOTIC GOWN (GOWN DISPOSABLE) ×4 IMPLANT
PACK VAGINAL WOMENS (CUSTOM PROCEDURE TRAY) ×3 IMPLANT
PAD POSITIONING PINK XL (MISCELLANEOUS) ×4 IMPLANT
PLUG CATH AND CAP STER (CATHETERS) ×5 IMPLANT
SCISSORS LAP 5X45 EPIX DISP (ENDOMECHANICALS) IMPLANT
SEALER TISSUE G2 CVD JAW 45CM (ENDOMECHANICALS) ×4 IMPLANT
SET CYSTO W/LG BORE CLAMP LF (SET/KITS/TRAYS/PACK) ×4 IMPLANT
SET IRRIG TUBING LAPAROSCOPIC (IRRIGATION / IRRIGATOR) IMPLANT
SLING HALO OBTRYX (Sling) ×4 IMPLANT
SLING HALO OBTRYX NDL (Sling) IMPLANT
SUT CAPIO PGA 48IN SZ 0 (SUTURE) ×1 IMPLANT
SUT CAPIO POLYGLYCOLIC (SUTURE) ×4 IMPLANT
SUT MNCRL 0 MO-4 VIOLET 18 CR (SUTURE) ×3 IMPLANT
SUT MNCRL 0 VIOLET 6X18 (SUTURE) ×3 IMPLANT
SUT MON AB 2-0 CT1 27 (SUTURE) ×8 IMPLANT
SUT MONOCRYL 0 6X18 (SUTURE) ×1
SUT MONOCRYL 0 MO 4 18  CR/8 (SUTURE) ×3
SUT PLAIN 2 0 (SUTURE)
SUT PLAIN ABS 2-0 54XMFL TIE (SUTURE) IMPLANT
SUT VIC AB 2-0 CT1 27 (SUTURE) ×4
SUT VIC AB 2-0 CT1 TAPERPNT 27 (SUTURE) ×3 IMPLANT
SUT VIC AB 2-0 CT2 27 (SUTURE) ×12 IMPLANT
SUT VIC AB 2-0 UR6 27 (SUTURE) ×8 IMPLANT
SUT VIC AB 4-0 PS2 27 (SUTURE) ×4 IMPLANT
SUT VICRYL 0 UR6 27IN ABS (SUTURE) IMPLANT
SYR 30ML LL (SYRINGE) ×4 IMPLANT
SYRINGE 10CC LL (SYRINGE) ×4 IMPLANT
TOWEL OR 17X24 6PK STRL BLUE (TOWEL DISPOSABLE) ×8 IMPLANT
TRAY FOLEY CATH SILVER 14FR (SET/KITS/TRAYS/PACK) ×4 IMPLANT
TROCAR XCEL NON-BLD 11X100MML (ENDOMECHANICALS) ×4 IMPLANT
TROCAR XCEL NON-BLD 5MMX100MML (ENDOMECHANICALS) ×4 IMPLANT
WARMER LAPAROSCOPE (MISCELLANEOUS) ×4 IMPLANT
WATER STERILE IRR 1000ML POUR (IV SOLUTION) ×4 IMPLANT

## 2014-09-08 NOTE — Progress Notes (Signed)
Great pain relief Tolerating regular diet and passing flatus Ambulating  VSS Afeb UO clear Lungs CTA Cor RRR Abd soft, BS+ Ext PAS on  A/P: Stable, per orders

## 2014-09-08 NOTE — Anesthesia Procedure Notes (Signed)
Procedure Name: Intubation Date/Time: 09/08/2014 7:42 AM Performed by: Ignacia Bayley Pre-anesthesia Checklist: Patient identified, Emergency Drugs available, Suction available and Patient being monitored Patient Re-evaluated:Patient Re-evaluated prior to inductionOxygen Delivery Method: Circle system utilized Preoxygenation: Pre-oxygenation with 100% oxygen Intubation Type: IV induction Ventilation: Mask ventilation without difficulty Laryngoscope Size: Mac and 3 Grade View: Grade II Tube type: Oral (intubation by Debara Pickett) Tube size: 7.0 mm Number of attempts: 1 Airway Equipment and Method: Stylet Placement Confirmation: ETT inserted through vocal cords under direct vision,  positive ETCO2 and breath sounds checked- equal and bilateral Secured at: 20 cm Dental Injury: Teeth and Oropharynx as per pre-operative assessment

## 2014-09-08 NOTE — Transfer of Care (Signed)
Immediate Anesthesia Transfer of Care Note  Patient: Robin Wells  Procedure(s) Performed: Procedure(s): LAPAROSCOPIC ASSISTED VAGINAL HYSTERECTOMY (N/A) SALPINGO OOPHORECTOMY (Bilateral) ANTERIOR AND POSTERIOR REPAIR WITH SACROSPINOUS FIXATION (N/A) TRANSOBTURATOR SLING (N/A) CYSTOSCOPY  Patient Location: PACU  Anesthesia Type:General  Level of Consciousness: awake  Airway & Oxygen Therapy: Patient Spontanous Breathing and Patient connected to nasal cannula oxygen  Post-op Assessment: Report given to RN and Post -op Vital signs reviewed and stable  Post vital signs: stable  Last Vitals:  Filed Vitals:   09/08/14 0603  BP: 150/66  Pulse: 65  Temp: 36.8 C  Resp: 20    Complications: No apparent anesthesia complications

## 2014-09-08 NOTE — Addendum Note (Signed)
Addendum  created 09/08/14 1623 by Raenette Rover, CRNA   Modules edited: Notes Section   Notes Section:  File: [id number]

## 2014-09-08 NOTE — H&P (Signed)
Robin Wells is an 64 y.o. female with pelvic prolapse. Uterus now totally protrudes through vaginal introitus. Urodynamics C/W GSUI.  Pertinent Gynecological History: Menses: post-menopausal Bleeding: none Contraception: none DES exposure: denies Blood transfusions: none Sexually transmitted diseases: no past history Previous GYN Procedures: DNC  Last mammogram: normal Date: 2016 Last pap: normal Date: 2016 OB History: G2, P1   Menstrual History: Menarche age: unknown  No LMP recorded. Patient is postmenopausal.    Past Medical History  Diagnosis Date  . Hypertension   . Cancer     adrenal gland    Past Surgical History  Procedure Laterality Date  . Adrenal gland surgery    . Knee arthroscopy    . Dilation and curettage of uterus    . Cervical polypectomy      No family history on file.  Social History:  reports that she has never smoked. She does not have any smokeless tobacco history on file. She reports that she does not drink alcohol or use illicit drugs.  Allergies:  Allergies  Allergen Reactions  . Keflex [Cephalexin] Other (See Comments)    Dr listed as an allergy.  . Penicillins Other (See Comments)    Causes yeast infection.  . Sulfa Antibiotics Hives    Prescriptions prior to admission  Medication Sig Dispense Refill Last Dose  . acetaminophen (TYLENOL) 500 MG tablet Take 1,000 mg by mouth every 6 (six) hours as needed for mild pain.   Past Month at Unknown time  . Cholecalciferol (VITAMIN D) 2000 UNITS tablet Take 2,000 Units by mouth daily.   Past Month at Unknown time  . clotrimazole-betamethasone (LOTRISONE) cream Apply 1 application topically 2 (two) times daily as needed (For dermatitis.).   Past Month at Unknown time  . GLUCOSAMINE HCL-MSM PO Take 1,500 mg by mouth daily.   Past Month at Unknown time  . lisinopril (PRINIVIL,ZESTRIL) 10 MG tablet Take 10 mg by mouth daily.   09/07/2014 at 2200  . loratadine (CLARITIN) 10 MG tablet Take 10 mg by  mouth daily.   09/07/2014 at 2200  . Multiple Vitamin (MULTIVITAMIN WITH MINERALS) TABS tablet Take 1 tablet by mouth daily.   Past Month at Unknown time  . simvastatin (ZOCOR) 20 MG tablet Take 20 mg by mouth daily.   09/07/2014 at 2200    Review of Systems  Constitutional: Negative for fever.    Blood pressure 150/66, pulse 65, temperature 98.2 F (36.8 C), temperature source Oral, resp. rate 20, SpO2 100 %. Physical Exam  Cardiovascular: Normal rate and regular rhythm.   Respiratory: Effort normal and breath sounds normal.  GI: Soft. Bowel sounds are normal.  Genitourinary:  Uterus normal sized, protruding through vaginal introitus Adnexa without masses    No results found for this or any previous visit (from the past 24 hour(s)).  No results found.  Assessment/Plan: 63 yo with uterine prolapse and GSUI D/W patient LAVH/BSO, A&P repair, SSLS and TOT Risks reviewed including infection, organ damage, bleeding/transfusion-HIV/Hep, DVT/PE, pneumonia, fistula, laparotomy, return to OR, recurrent prolapse, recurrent SUI, erosion, pelvic pain, abdominal pain, vaginal pain, painful intercourse, prolonged catheretization, return to OR. Patient states she understands and agrees [facility],Demitrious Mccannon E 09/08/2014, 6:56 AM

## 2014-09-08 NOTE — Progress Notes (Signed)
No changes to H&P per patient history Reviewed with patient procedure-LAVH/BSO, A&P repair, SSLS, TOT Patient states she understands and agrees

## 2014-09-08 NOTE — Anesthesia Postprocedure Evaluation (Signed)
  Anesthesia Post-op Note  Patient: Robin Wells  Procedure(s) Performed: Procedure(s): LAPAROSCOPIC ASSISTED VAGINAL HYSTERECTOMY (N/A) SALPINGO OOPHORECTOMY (Bilateral) ANTERIOR AND POSTERIOR REPAIR WITH SACROSPINOUS FIXATION (N/A) TRANSOBTURATOR SLING (N/A) CYSTOSCOPY Patient is awake and responsive. Pain and nausea are reasonably well controlled. Vital signs are stable and clinically acceptable. Oxygen saturation is clinically acceptable. There are no apparent anesthetic complications at this time. Patient is ready for discharge.

## 2014-09-08 NOTE — Anesthesia Postprocedure Evaluation (Signed)
  Anesthesia Post-op Note  Patient: Robin Wells  Procedure(s) Performed: Procedure(s): LAPAROSCOPIC ASSISTED VAGINAL HYSTERECTOMY (N/A) SALPINGO OOPHORECTOMY (Bilateral) ANTERIOR AND POSTERIOR REPAIR WITH SACROSPINOUS FIXATION (N/A) TRANSOBTURATOR SLING (N/A) CYSTOSCOPY  Patient Location: Women's Unit  Anesthesia Type:General  Level of Consciousness: awake, alert , oriented and patient cooperative  Airway and Oxygen Therapy: Patient Spontanous Breathing and Patient connected to nasal cannula oxygen  Post-op Pain: none  Post-op Assessment: Post-op Vital signs reviewed, Patient's Cardiovascular Status Stable, Respiratory Function Stable, Patent Airway and No signs of Nausea or vomiting              Post-op Vital Signs: Reviewed and stable  Last Vitals:  Filed Vitals:   09/08/14 1310  BP: 139/66  Pulse: 63  Temp: 36.9 C  Resp: 18    Complications: No apparent anesthesia complications

## 2014-09-08 NOTE — Brief Op Note (Signed)
09/08/2014  10:25 AM  PATIENT:  Robin Wells  64 y.o. female  PRE-OPERATIVE DIAGNOSIS:  PROLAPSE, CYSTOCELE, RECTOCELE, SUI  POST-OPERATIVE DIAGNOSIS:  PROLAPSE,CYSTOCELE, RECTOCELE, SUI  PROCEDURE:  Procedure(s): LAPAROSCOPIC ASSISTED VAGINAL HYSTERECTOMY (N/A) SALPINGO OOPHORECTOMY (Bilateral) ANTERIOR AND POSTERIOR REPAIR WITH SACROSPINOUS FIXATION (N/A) TRANSOBTURATOR SLING (N/A) CYSTOSCOPY  SURGEON:  Surgeon(s) and Role:    * Everlene Farrier, MD - Primary    * W Evette Cristal, MD - Assisting  PHYSICIAN ASSISTANT:   ASSISTANTS: none   ANESTHESIA:   general  EBL:  Total I/O In: 2000 [I.V.:2000] Out: 700 [Urine:550; Blood:150]  BLOOD ADMINISTERED:none  DRAINS: Urinary Catheter (Foley)   LOCAL MEDICATIONS USED:  MARCAINE    and LIDOCAINE   SPECIMEN:  Source of Specimen:  uterus bilateral tubes and ovaries  DISPOSITION OF SPECIMEN:  PATHOLOGY  COUNTS:  YES  TOURNIQUET:  * No tourniquets in log *  DICTATION: .Other Dictation: Dictation Number [id number]  PLAN OF CARE: Admit to inpatient   PATIENT DISPOSITION:  PACU - hemodynamically stable.   Delay start of Pharmacological VTE agent (>24hrs) due to surgical blood loss or risk of bleeding: not applicable

## 2014-09-09 ENCOUNTER — Encounter (HOSPITAL_COMMUNITY): Payer: Self-pay | Admitting: *Deleted

## 2014-09-09 LAB — CBC
HCT: 31.5 % — ABNORMAL LOW (ref 36.0–46.0)
Hemoglobin: 10.4 g/dL — ABNORMAL LOW (ref 12.0–15.0)
MCH: 29.3 pg (ref 26.0–34.0)
MCHC: 33 g/dL (ref 30.0–36.0)
MCV: 88.7 fL (ref 78.0–100.0)
PLATELETS: 193 10*3/uL (ref 150–400)
RBC: 3.55 MIL/uL — ABNORMAL LOW (ref 3.87–5.11)
RDW: 12.9 % (ref 11.5–15.5)
WBC: 15.2 10*3/uL — ABNORMAL HIGH (ref 4.0–10.5)

## 2014-09-09 MED ORDER — OXYCODONE-ACETAMINOPHEN 5-325 MG PO TABS: 1.0000 | ORAL_TABLET | Freq: Four times a day (QID) | ORAL | Status: AC | PRN

## 2014-09-09 MED ORDER — IBUPROFEN 600 MG PO TABS: 600.0000 mg | ORAL_TABLET | Freq: Four times a day (QID) | ORAL | Status: AC | PRN

## 2014-09-09 NOTE — Op Note (Signed)
Robin Wells, Robin Wells                ACCOUNT NO.:  [id number]  MEDICAL RECORD NO.:  [id number]  LOCATION:  9305                          FACILITY:  [facility]  PHYSICIAN:  Daleen Bo. Gaetano Net, M.D. DATE OF BIRTH:  02/09/1950  DATE OF PROCEDURE:  09/08/2014 DATE OF DISCHARGE:                              OPERATIVE REPORT   PREOPERATIVE DIAGNOSES: 1. Uterine procidentia. 2. Stress urinary continence.  POSTOPERATIVE DIAGNOSES: 1. Uterine procidentia. 2. Stress urinary continence.  PROCEDURES:  Laparoscopically-assisted vaginal hysterectomy with bilateral salpingo-oophorectomy, anterior-posterior vaginal repair, sacrospinous ligament suspension, transobturator midurethral sling and cystoscopy.  SURGEON:  Daleen Bo. Gaetano Net, M.D.  ASSISTANTMaisie Fus, M.D.  ANESTHESIA:  General with endotracheal intubation.  ESTIMATED BLOOD LOSS:  150 mL.  SPECIMEN:  Uterus, bilateral fallopian tubes and ovaries to Pathology.  INDICATIONS AND CONSENT:  This patient is a 64 year old multiparous patient, who has had years of pelvic relaxation.  She has recently developed total procidentia.  She requests repair.  Options were carefully discussed.  Urodynamic testing in the office with the procidentia reduced was consistent with genuine stress urinary incontinence.  Laparoscopically-assisted vaginal hysterectomy with bilateral salpingo-oophorectomy, sacrospinous ligament suspension, anterior-posterior vaginal repair, transobturator mid-urethral sling and cystoscopy have been discussed preoperatively.  Potential risks and complications were reviewed preoperatively including, but not limited to, infection, organ damage, bleeding requiring transfusion of blood products with HIV and hepatitis acquisition, DVT, PE, pneumonia, fistula formation, pelvic pain, abdominal pain, painful intercourse, recurrence of prolapse, return to the operating room, erosion of the sling, painful intercourse, prolonged  catheterizations and recurrent incontinence.  All questions had been answered and consent was signed on the chart.  FINDINGS:  Upper abdomen was grossly normal.  On the pelvis, the uterus was normal size, and fallopian tubes and ovaries were normal bilaterally.  DESCRIPTION OF PROCEDURE:  The patient was taken to the operating room, where she was identified, placed in dorsal supine position and general anesthesia was induced via endotracheal intubation.  She was placed in the dorsal lithotomy position.  Time-out was undertaken.  She was prepped abdominally and vaginally, and the procidentia was reduced to help accomplish this.  She was straight catheterized.  She was then draped in a sterile fashion.  The infraumbilical and suprapubic areas were injected in midline with approximately 6 mL of 0.5% plain Marcaine. Small infraumbilical incision was made and a disposable Veress needle was placed on the first attempt without difficulty.  Good syringe and drop test were noted, and 2 liters of gas were insufflated under low pressure with good tympany in the right upper quadrant.  Veress needle was removed and a 10/11 Xcel bladeless disposable trocar sleeve was placed using direct visualization with the diagnostic scope.  The operative laparoscope was then used.  A small suprapubic incision was made and a 5-mm disposable trocar sleeve was placed under direct visualization without difficulty.  After noting the above findings, the EnSeal bipolar cautery cutting instrument was used to take down the right infundibulopelvic ligament coming across to the round ligament down the level of the vesicouterine peritoneum.  Small number of adhesions from the sigmoid epiploica to the left pelvis were taken down and then the  left IP ligament was taken down to the level of vesicouterine peritoneum in a similar fashion.  Vesicouterine peritoneum was taken down cephalad laterally.  Suprapubic trocar sleeve  was removed, instruments were removed and attention was turned to the vagina.  The tissues of the vagina were quite indurated.  The posterior fornix was incised and this was also used to circumscribe the cervix with unipolar cautery.  Mucosa was advanced sharply and bluntly. Progressive bites of the bladder pillars were taken bilaterally with a handheld LigaSure bipolar cautery.  This allowed entry into the posterior cul-de-sac.  Further dissection entered the anterior cul-de- sac successfully as well.  Progressive bites were taken through the cardinal ligaments and uterine vessels bilaterally.  The fundus was delivered posteriorly and the remaining pedicles were taken down under good visualization and the specimen was delivered.  All suture will be 0 Monocryl unless otherwise designated.  Uterosacral ligaments were plicated the vaginal cuff bilaterally and then plicated in the midline with a third suture.  The posterior two-thirds of the cuff were closed with figure-of-eights.  The anterior vaginal mucosa was then widely injected with a 0.5% lidocaine with 1:200,000 epinephrine, diluted in 100 mL of saline.  The anterior vaginal mucosa was then taken down to the midline to a point approximately 4 cm below the urethral meatus.  It was widely dissected bilaterally.  Again, the vaginal mucosa was quite indurated, but this was accomplished safely.  The cystocele was reduced with multiple pursestring sutures and figure-of-eights.  Excess mucosa was trimmed.  The anterior half of the cuff was closed with two figure- of-eight 0 Monocryl sutures.  The anterior vaginal mucosa was then closed in a running, locking fashion with 2-0 Vicryl.  Attention was then returned to the posterior vagina.  Posterior vaginal mucosa was widely injected with the same dilute solution.  Very thin band of tissue was resected from posterior perineal body, which allowed dissection of the posterior vaginal mucosa in the  midline for the lower two-thirds of the vagina.  Dissection was carried out bilaterally sharply and bluntly. This allowed identification of the right ischiorectal spine and sacrospinous ligament.  Using the Capio needle driver, a Vicryl suture was placed through the sacrospinous ligament 1 to 1-1/2 fingerbreadth medial to the ischial spine.  This was then plicated to the posterior portion of the superior vagina under good visualization.  These sutures were held.  The rectovaginal mucosa was then plicated in the midline with interrupted suture.  Excess mucosa was removed.  The vaginal mucosa was then closed to the upper one-third with a running locking 2-0 suture, which was then held.  This allowed the sacrospinous ligament to then be tied down elevating the vaginal fornix.  The remainder of the posterior vaginal cuff was then closed in a running, locking fashion with the same suture.  The transobturator sling was then done.  The points of inguinal incisions were marked bilaterally after careful palpation.  These were injected with the same dilute solution.  The suburethral vaginal mucosa was injected with the same thing.  A small longitudinal suburethral incision was made in the mucosa, which was dissected bilaterally to the sidewalls.  The halo Obtryx needles were then placed through the angle of incisions bilaterally with the tip of the needle being guided by the examining finger.  They were both exited through the suburethral incision.  It should be noted that prior to this, Foley catheter was placed in the bladder and the bladder was drained.  After  passage of the needles, the Foley catheter was removed and cystoscopy was carried out with a 70-degree cystoscope.  The patient had been given fluorescein.  A 360-degree inspection revealed the bladder to be intact.  No evidence of perforation of the bladder.  No foreign bodies and a good puff of urine from the ureters  bilaterally. Cystoscope was removed.  Foley catheter was replaced draining the bladder.  The Obtryx sling was then placed on the needle tips and the needles were both then withdrawn back through the inguinal incisions. The sheath was removed intact on both sides.  Care was taken to make sure the sling was lying flat with no rolls or kinks.  A Kelly clamp placed below the sling can be easily rotated to 90 degrees to the floor with 0 tension.  The sling was trimmed at the level of the skin bilaterally and the suburethral incision was closed with a running locking 2-0 suture.  Two vaginal packs tied together were then placed in the vagina with Estrace cream.  The Foley was placed to straight drain. Attention was returned to the abdomen.  Pneumoperitoneum was reintroduced.  The suprapubic trocar sleeve was reintroduced under direct visualization.  Irrigation was carried out and careful inspection on reduced pneumoperitoneum revealed good hemostasis all around.  Excess fluid was removed and the remaining approximately 26 mL of 0.5% plain Marcaine was instilled into peritoneal cavity.  Suprapubic trocar sleeve was removed.  Pneumoperitoneum was reduced.  The umbilical trocar sleeve was removed.  Both incisions were closed with interrupted 4-0 Vicryl suture.  Glue was applied to all the incisions including the inguinal incisions.  All counts were correct.  The patient was awake and taken to the recovery room in stable condition.     Daleen Bo Gaetano Net, M.D.     JET/MEDQ  D:  09/08/2014  T:  09/09/2014  Job:  [id number]

## 2014-09-09 NOTE — Progress Notes (Signed)
Patient instructed on how to clean foley, how to empty foley, and how to use leg bad. Patient verbalizes understanding. Patient states she has follow up appointment with Dr. Gaetano Net on Monday August 22.

## 2014-09-09 NOTE — Discharge Summary (Signed)
Physician Discharge Summary  Patient ID: Robin Wells MRN: [id number] DOB/AGE: 1950-12-31 64 y.o.  Admit date: 09/08/2014 Discharge date: 09/09/2014  Admission Diagnoses: uterine procidentia  Discharge Diagnoses:  Active Problems:   Uterine procidentia   Discharged Condition: good  Hospital Course: good post operative recovery of bowel function. Ambulating well with good pain relief. Vaginal packing and foley catheter out. Patient has not yet voided. Will check post void residual volume and if satisfactory will D/C home without catheter. If post void volume is elevated, will D/C home with foley catheter in place and follow up in office in 3 days.  Consults: None  Significant Diagnostic Studies: labs:  Results for orders placed or performed during the hospital encounter of 09/08/14 (from the past 24 hour(s))  CBC     Status: Abnormal   Collection Time: 09/09/14  5:20 AM  Result Value Ref Range   WBC 15.2 (H) 4.0 - 10.5 K/uL   RBC 3.55 (L) 3.87 - 5.11 MIL/uL   Hemoglobin 10.4 (L) 12.0 - 15.0 g/dL   HCT 31.5 (L) 36.0 - 46.0 %   MCV 88.7 78.0 - 100.0 fL   MCH 29.3 26.0 - 34.0 pg   MCHC 33.0 30.0 - 36.0 g/dL   RDW 12.9 11.5 - 15.5 %   Platelets 193 150 - 400 K/uL    Treatments: surgery: LAVH/BSO, A&P repair, SSLS, TOT, cystoscopy  Discharge Exam: Blood pressure 118/52, pulse 68, temperature 98 F (36.7 C), temperature source Oral, resp. rate 18, height 5' 4.25" (1.632 m), weight 193 lb (87.544 kg), SpO2 96 %. General appearance: alert, cooperative and no distress GI: soft, non-tender; bowel sounds normal; no masses,  no organomegaly  Disposition:      Medication List    TAKE these medications        acetaminophen 500 MG tablet  Commonly known as:  TYLENOL  Take 1,000 mg by mouth every 6 (six) hours as needed for mild pain.     clotrimazole-betamethasone cream  Commonly known as:  LOTRISONE  Apply 1 application topically 2 (two) times daily as needed (For  dermatitis.).     GLUCOSAMINE HCL-MSM PO  Take 1,500 mg by mouth daily.     ibuprofen 600 MG tablet  Commonly known as:  ADVIL,MOTRIN  Take 1 tablet (600 mg total) by mouth every 6 (six) hours as needed (mild pain).     lisinopril 10 MG tablet  Commonly known as:  PRINIVIL,ZESTRIL  Take 10 mg by mouth daily.     loratadine 10 MG tablet  Commonly known as:  CLARITIN  Take 10 mg by mouth daily.     multivitamin with minerals Tabs tablet  Take 1 tablet by mouth daily.     oxyCODONE-acetaminophen 5-325 MG per tablet  Commonly known as:  PERCOCET/ROXICET  Take 1-2 tablets by mouth every 6 (six) hours as needed (moderate to severe pain (when tolerating fluids)).     simvastatin 20 MG tablet  Commonly known as:  ZOCOR  Take 20 mg by mouth daily.     Vitamin D 2000 UNITS tablet  Take 2,000 Units by mouth daily.           Follow-up Information    Follow up In 2 weeks.      Signed: Remi Lopata II,Jaelen Gellerman E 09/09/2014, 8:15 AM

## 2014-09-09 NOTE — Progress Notes (Signed)
Patient discharged home with son... Discharge instructions reviewed with patient and she verbalized understanding... Condition stable... No equipment... Taken to car via wheelchair by C. [place], [place] [facility] [place].

## 2014-09-09 NOTE — Progress Notes (Signed)
Good pain control Tolerating regular diet, passing flatus, ambulating  VSS Afeb Abd soft  Pack out Foley out-no void yet  Results for orders placed or performed during the hospital encounter of 09/08/14 (from the past 24 hour(s))  CBC     Status: Abnormal   Collection Time: 09/09/14  5:20 AM  Result Value Ref Range   WBC 15.2 (H) 4.0 - 10.5 K/uL   RBC 3.55 (L) 3.87 - 5.11 MIL/uL   Hemoglobin 10.4 (L) 12.0 - 15.0 g/dL   HCT 31.5 (L) 36.0 - 46.0 %   MCV 88.7 78.0 - 100.0 fL   MCH 29.3 26.0 - 34.0 pg   MCHC 33.0 30.0 - 36.0 g/dL   RDW 12.9 11.5 - 15.5 %   Platelets 193 150 - 400 K/uL   A/P: Satisfactory recovery         Await void and then post void bladder scan-if satisfactory then D/C home, if post void volume elevated will D/C home with foley in place

## 2014-09-09 NOTE — Progress Notes (Signed)
Dr. Gaetano Net notified of patient's post void residual.. Orders received to insert foley with leg bad. Will call in antibiotic prescription for patient and instruct her to make an appointment for Monday to have foley removed. Patient updated of plan of care.

## 2015-01-26 MED FILL — LISINOPRIL 10 MG TABLET: 10 | 90 days supply | Qty: 90 | Fill #0

## 2015-02-24 MED FILL — SIMVASTATIN 20 MG TABLET: 20 | 90 days supply | Qty: 90 | Fill #1

## 2015-03-23 DIAGNOSIS — N819 Female genital prolapse, unspecified: Secondary | ICD-10-CM | POA: Diagnosis not present

## 2015-03-29 DIAGNOSIS — R8290 Unspecified abnormal findings in urine: Secondary | ICD-10-CM | POA: Diagnosis not present

## 2015-03-29 DIAGNOSIS — N8110 Cystocele, unspecified: Secondary | ICD-10-CM | POA: Diagnosis not present

## 2015-04-25 MED FILL — LISINOPRIL 10 MG TABLET: 10 | 90 days supply | Qty: 90 | Fill #1

## 2015-05-03 DIAGNOSIS — Z1211 Encounter for screening for malignant neoplasm of colon: Secondary | ICD-10-CM | POA: Diagnosis not present

## 2015-05-03 DIAGNOSIS — E7800 Pure hypercholesterolemia, unspecified: Secondary | ICD-10-CM | POA: Diagnosis not present

## 2015-05-03 DIAGNOSIS — Z209 Contact with and (suspected) exposure to unspecified communicable disease: Secondary | ICD-10-CM | POA: Diagnosis not present

## 2015-05-03 DIAGNOSIS — I10 Essential (primary) hypertension: Secondary | ICD-10-CM | POA: Diagnosis not present

## 2015-05-03 DIAGNOSIS — E6609 Other obesity due to excess calories: Secondary | ICD-10-CM | POA: Diagnosis not present

## 2015-05-03 DIAGNOSIS — R739 Hyperglycemia, unspecified: Secondary | ICD-10-CM | POA: Diagnosis not present

## 2015-05-10 DIAGNOSIS — Z01818 Encounter for other preprocedural examination: Secondary | ICD-10-CM | POA: Diagnosis not present

## 2015-05-15 DIAGNOSIS — Z6833 Body mass index (BMI) 33.0-33.9, adult: Secondary | ICD-10-CM | POA: Diagnosis not present

## 2015-05-15 DIAGNOSIS — R339 Retention of urine, unspecified: Secondary | ICD-10-CM | POA: Diagnosis not present

## 2015-05-15 DIAGNOSIS — N8110 Cystocele, unspecified: Secondary | ICD-10-CM | POA: Diagnosis not present

## 2015-05-15 DIAGNOSIS — Z79899 Other long term (current) drug therapy: Secondary | ICD-10-CM | POA: Diagnosis not present

## 2015-05-15 DIAGNOSIS — Z888 Allergy status to other drugs, medicaments and biological substances status: Secondary | ICD-10-CM | POA: Diagnosis not present

## 2015-05-15 DIAGNOSIS — R8290 Unspecified abnormal findings in urine: Secondary | ICD-10-CM | POA: Diagnosis not present

## 2015-05-15 DIAGNOSIS — Z880 Allergy status to penicillin: Secondary | ICD-10-CM | POA: Diagnosis not present

## 2015-05-15 DIAGNOSIS — I10 Essential (primary) hypertension: Secondary | ICD-10-CM | POA: Diagnosis not present

## 2015-05-15 DIAGNOSIS — N993 Prolapse of vaginal vault after hysterectomy: Secondary | ICD-10-CM | POA: Diagnosis not present

## 2015-05-15 DIAGNOSIS — E669 Obesity, unspecified: Secondary | ICD-10-CM | POA: Diagnosis not present

## 2015-05-16 DIAGNOSIS — R339 Retention of urine, unspecified: Secondary | ICD-10-CM | POA: Diagnosis not present

## 2015-05-16 DIAGNOSIS — Z880 Allergy status to penicillin: Secondary | ICD-10-CM | POA: Diagnosis not present

## 2015-05-16 DIAGNOSIS — I10 Essential (primary) hypertension: Secondary | ICD-10-CM | POA: Diagnosis not present

## 2015-05-16 DIAGNOSIS — N8110 Cystocele, unspecified: Secondary | ICD-10-CM | POA: Diagnosis not present

## 2015-05-16 DIAGNOSIS — Z6833 Body mass index (BMI) 33.0-33.9, adult: Secondary | ICD-10-CM | POA: Diagnosis not present

## 2015-05-16 DIAGNOSIS — E669 Obesity, unspecified: Secondary | ICD-10-CM | POA: Diagnosis not present

## 2015-05-16 DIAGNOSIS — Z79899 Other long term (current) drug therapy: Secondary | ICD-10-CM | POA: Diagnosis not present

## 2015-05-16 DIAGNOSIS — Z888 Allergy status to other drugs, medicaments and biological substances status: Secondary | ICD-10-CM | POA: Diagnosis not present

## 2015-05-19 DIAGNOSIS — Z466 Encounter for fitting and adjustment of urinary device: Secondary | ICD-10-CM | POA: Diagnosis not present

## 2015-05-22 MED FILL — PREMARIN VAGINAL CREAM-APPL: 0.625 | 60 days supply | Qty: 30 | Fill #0

## 2015-05-22 MED FILL — HYDROCODON-APAP 5-325: 5-325 | 5 days supply | Qty: 30 | Fill #0

## 2015-06-05 MED FILL — SIMVASTATIN 20 MG TABLET: 20 | 90 days supply | Qty: 90 | Fill #2

## 2015-06-28 DIAGNOSIS — Z09 Encounter for follow-up examination after completed treatment for conditions other than malignant neoplasm: Secondary | ICD-10-CM | POA: Diagnosis not present

## 2015-06-28 DIAGNOSIS — R8290 Unspecified abnormal findings in urine: Secondary | ICD-10-CM | POA: Diagnosis not present

## 2015-06-30 MED FILL — NITROFURANTOIN MONO-MCR 100: 100 | 5 days supply | Qty: 10 | Fill #0

## 2015-07-27 MED FILL — PREMARIN VAGINAL CREAM-APPL: 0.625 | 60 days supply | Qty: 30 | Fill #1

## 2015-07-27 MED FILL — LISINOPRIL 10 MG TABLET: 10 | 90 days supply | Qty: 90 | Fill #2

## 2015-08-04 ENCOUNTER — Emergency Department (HOSPITAL_BASED_OUTPATIENT_CLINIC_OR_DEPARTMENT_OTHER)
Admission: EM | Admit: 2015-08-04 | Discharge: 2015-08-05 | Disposition: A | Discharge status: Home / Self Care | Payer: 59 | Attending: Emergency Medicine | Admitting: Emergency Medicine

## 2015-08-04 ENCOUNTER — Encounter (HOSPITAL_BASED_OUTPATIENT_CLINIC_OR_DEPARTMENT_OTHER): Payer: Self-pay | Admitting: *Deleted

## 2015-08-04 DIAGNOSIS — I10 Essential (primary) hypertension: Secondary | ICD-10-CM | POA: Diagnosis not present

## 2015-08-04 DIAGNOSIS — R509 Fever, unspecified: Secondary | ICD-10-CM | POA: Diagnosis present

## 2015-08-04 DIAGNOSIS — Z85858 Personal history of malignant neoplasm of other endocrine glands: Secondary | ICD-10-CM | POA: Diagnosis not present

## 2015-08-04 DIAGNOSIS — N390 Urinary tract infection, site not specified: Secondary | ICD-10-CM | POA: Diagnosis not present

## 2015-08-04 HISTORY — DX: Malignant neoplasm of unspecified part of unspecified adrenal gland: C74.90

## 2015-08-04 LAB — URINALYSIS, ROUTINE W REFLEX MICROSCOPIC
BILIRUBIN URINE: NEGATIVE
Glucose, UA: NEGATIVE mg/dL
Hgb urine dipstick: NEGATIVE
Ketones, ur: NEGATIVE mg/dL
NITRITE: NEGATIVE
Protein, ur: NEGATIVE mg/dL
SPECIFIC GRAVITY, URINE: 1.014 (ref 1.005–1.030)
pH: 6.5 (ref 5.0–8.0)

## 2015-08-04 LAB — URINE MICROSCOPIC-ADD ON

## 2015-08-04 MED ORDER — ACETAMINOPHEN 500 MG PO TABS
1000.0000 mg | ORAL_TABLET | Freq: Once | ORAL | Status: AC
  Administered 2015-08-04: 1000 mg via ORAL
  Filled 2015-08-04: qty 2

## 2015-08-04 MED ORDER — PHENAZOPYRIDINE HCL 100 MG PO TABS
200.0000 mg | ORAL_TABLET | Freq: Once | ORAL | Status: AC
  Administered 2015-08-04: 200 mg via ORAL
  Filled 2015-08-04: qty 2

## 2015-08-04 MED ORDER — NITROFURANTOIN MONOHYD MACRO 100 MG PO CAPS
100.0000 mg | ORAL_CAPSULE | Freq: Once | ORAL | Status: AC
  Administered 2015-08-04: 100 mg via ORAL
  Filled 2015-08-04: qty 1

## 2015-08-04 NOTE — ED Notes (Addendum)
Fever and chills today. Lower back pain. States she was treated for a UTI 2 weeks ago.

## 2015-08-04 NOTE — ED Provider Notes (Signed)
CSN: [id number]     Arrival date & time 08/04/15  2027 History  By signing my name below, I, [facility], attest that this documentation has been prepared under the direction and in the presence of Labria Wos, MD. Electronically Signed: Virgel Bouquet, ED Scribe. 08/05/2015. 12:09 AM.   Chief Complaint  Patient presents with  . Fever    Patient is a 65 y.o. female presenting with dysuria. The history is provided by the patient. No language interpreter was used.  Dysuria Pain quality:  Burning Pain severity:  Moderate Onset quality:  Gradual Timing:  Constant Progression:  Unchanged Chronicity:  Recurrent Recent urinary tract infections: yes   Relieved by:  Nothing Worsened by:  Nothing tried Ineffective treatments:  None tried Urinary symptoms: frequent urination and hesitancy   Associated symptoms: fever   Associated symptoms: no abdominal pain, no nausea and no vomiting   Risk factors: no hx of pyelonephritis    HPI Comments: Robin Wells is a 65 y.o. female with a hx of HTN and adrenal cancer who presents to the Emergency Department complaining of constant, moderate fever onset 5 hours ago. Pt states that she has urinary burning whenever she is in the shower and that this evening she had chills that caused her to shake all over. She has not taken any medications or attempted any treatments. She notes recent sick contact with a coworker with a cough. Per pt, she was treated for a UTI 2 weeks ago. Denies rash, nausea, vomiting, sore throat, or cough.  Past Medical History  Diagnosis Date  . Hypertension   . Cancer [facility])     adrenal gland  . Adrenal cancer [facility])    Past Surgical History  Procedure Laterality Date  . Adrenal gland surgery    . Knee arthroscopy    . Dilation and curettage of uterus    . Cervical polypectomy    . Laparoscopic assisted vaginal hysterectomy N/A 09/08/2014    Procedure: LAPAROSCOPIC ASSISTED VAGINAL HYSTERECTOMY;  Surgeon: Everlene Farrier, MD;  Location: [facility];  Service: Gynecology;  Laterality: N/A;  . Salpingoophorectomy Bilateral 09/08/2014    Procedure: SALPINGO OOPHORECTOMY;  Surgeon: Everlene Farrier, MD;  Location: [facility];  Service: Gynecology;  Laterality: Bilateral;  . Anterior and posterior repair with sacrospinous fixation N/A 09/08/2014    Procedure: ANTERIOR AND POSTERIOR REPAIR WITH SACROSPINOUS FIXATION;  Surgeon: Everlene Farrier, MD;  Location: [facility];  Service: Gynecology;  Laterality: N/A;  . Pubovaginal sling N/A 09/08/2014    Procedure: Leonette Nutting;  Surgeon: Everlene Farrier, MD;  Location: [facility];  Service: Gynecology;  Laterality: N/A;  . Cystoscopy  09/08/2014    Procedure: CYSTOSCOPY;  Surgeon: Everlene Farrier, MD;  Location: [facility];  Service: Gynecology;;   No family history on file. Social History  Substance Use Topics  . Smoking status: Never Smoker   . Smokeless tobacco: None  . Alcohol Use: No   OB History    No data available     Review of Systems  Constitutional: Positive for fever and chills.  HENT: Negative for sore throat.   Respiratory: Negative for cough.   Gastrointestinal: Negative for nausea, vomiting and abdominal pain.  Genitourinary: Positive for dysuria.  Musculoskeletal: Positive for back pain.  Skin: Negative for rash.  All other systems reviewed and are negative.     Allergies  Keflex; Penicillins; and Sulfa antibiotics  Home Medications   Prior to Admission medications   Medication Sig Start Date End Date Taking?  Authorizing Provider  acetaminophen (TYLENOL) 500 MG tablet Take 1,000 mg by mouth every 6 (six) hours as needed for mild pain.    Historical Provider, MD  Cholecalciferol (VITAMIN D) 2000 UNITS tablet Take 2,000 Units by mouth daily.    Historical Provider, MD  clotrimazole-betamethasone (LOTRISONE) cream Apply 1 application topically 2 (two) times daily as needed (For dermatitis.).    Historical Provider, MD  GLUCOSAMINE HCL-MSM PO Take 1,500 mg  by mouth daily.    Historical Provider, MD  ibuprofen (ADVIL,MOTRIN) 600 MG tablet Take 1 tablet (600 mg total) by mouth every 6 (six) hours as needed (mild pain). 09/09/14   Everlene Farrier, MD  lisinopril (PRINIVIL,ZESTRIL) 10 MG tablet Take 10 mg by mouth daily.    Historical Provider, MD  loratadine (CLARITIN) 10 MG tablet Take 10 mg by mouth daily.    Historical Provider, MD  Multiple Vitamin (MULTIVITAMIN WITH MINERALS) TABS tablet Take 1 tablet by mouth daily.    Historical Provider, MD  oxyCODONE-acetaminophen (PERCOCET/ROXICET) 5-325 MG per tablet Take 1-2 tablets by mouth every 6 (six) hours as needed (moderate to severe pain (when tolerating fluids)). 09/09/14   Everlene Farrier, MD  simvastatin (ZOCOR) 20 MG tablet Take 20 mg by mouth daily.    Historical Provider, MD   BP 147/73 mmHg  Pulse 90  Temp(Src) 99.9 F (37.7 C) (Oral)  Resp 18  Ht 5\' 4"  (1.626 m)  Wt 197 lb (89.359 kg)  BMI 33.80 kg/m2  SpO2 100% Physical Exam  Constitutional: She is oriented to person, place, and time. She appears well-developed and well-nourished. No distress.  HENT:  Head: Normocephalic and atraumatic.  Mouth/Throat: Oropharynx is clear and moist and mucous membranes are normal. No oropharyngeal exudate.  Moist mucus membranes. No exudate.  Eyes: EOM are normal. Pupils are equal, round, and reactive to light.  Neck: Trachea normal and normal range of motion. No JVD present.  Trachea midline. No cervical or subclavicular lymphadenopathy.  Cardiovascular: Normal rate and regular rhythm.   Regular rate and rhythm  Pulmonary/Chest: Effort normal and breath sounds normal. No stridor. No respiratory distress. She has no wheezes. She has no rales.  Lungs CTA bilaterally.  Abdominal: Soft. Bowel sounds are normal. She exhibits no distension. There is no tenderness. There is no rebound, no guarding, no tenderness at McBurney's point and negative Murphy's sign.  Good bowel sounds  Musculoskeletal: Normal  range of motion.  Lymphadenopathy:    She has no cervical adenopathy.  Neurological: She is alert and oriented to person, place, and time. She has normal reflexes.  Skin: Skin is warm and dry. She is not diaphoretic.  Psychiatric: She has a normal mood and affect. Her behavior is normal.  Nursing note and vitals reviewed.   ED Course  Procedures   DIAGNOSTIC STUDIES: Oxygen Saturation is 98% on RA, normal by my interpretation.    COORDINATION OF CARE: 11:03 PM Discussed results of labs. Will order Macrobid, Pyridium, and Tylenol. Will monitor pt in ED. Discussed treatment plan with pt at bedside and pt agreed to plan.  Medications  acetaminophen (TYLENOL) tablet 1,000 mg (1,000 mg Oral Given 08/04/15 2043)  nitrofurantoin (macrocrystal-monohydrate) (MACROBID) capsule 100 mg (100 mg Oral Given 08/04/15 2314)  phenazopyridine (PYRIDIUM) tablet 200 mg (200 mg Oral Given 08/04/15 2314)    Labs Review Labs Reviewed  URINALYSIS, ROUTINE W REFLEX MICROSCOPIC (NOT AT [facility]) - Abnormal; Notable for the following:    Leukocytes, UA TRACE (*)    All other components  within normal limits  URINE MICROSCOPIC-ADD ON - Abnormal; Notable for the following:    Squamous Epithelial / LPF 0-5 (*)    Bacteria, UA FEW (*)    All other components within normal limits    Imaging Review No results found. I have personally reviewed and evaluated these images and lab results as part of my medical decision-making.   EKG Interpretation None      MDM   Final diagnoses:  None   Filed Vitals:   08/04/15 2040 08/04/15 2257  BP: 152/72 147/73  Pulse: 90 90  Temp: 103.1 F (39.5 C) 99.9 F (37.7 C)  Resp: 18 18    Results for orders placed or performed during the hospital encounter of 08/04/15  Urinalysis, Routine w reflex microscopic (not at [facility])  Result Value Ref Range   Color, Urine YELLOW YELLOW   APPearance CLEAR CLEAR   Specific Gravity, Urine 1.014 1.005 - 1.030   pH 6.5 5.0 - 8.0    Glucose, UA NEGATIVE NEGATIVE mg/dL   Hgb urine dipstick NEGATIVE NEGATIVE   Bilirubin Urine NEGATIVE NEGATIVE   Ketones, ur NEGATIVE NEGATIVE mg/dL   Protein, ur NEGATIVE NEGATIVE mg/dL   Nitrite NEGATIVE NEGATIVE   Leukocytes, UA TRACE (A) NEGATIVE  Urine microscopic-add on  Result Value Ref Range   Squamous Epithelial / LPF 0-5 (A) NONE SEEN   WBC, UA 0-5 0 - 5 WBC/hpf   RBC / HPF 0-5 0 - 5 RBC/hpf   Bacteria, UA FEW (A) NONE SEEN   No results found.  Medications  acetaminophen (TYLENOL) tablet 1,000 mg (1,000 mg Oral Given 08/04/15 2043)  nitrofurantoin (macrocrystal-monohydrate) (MACROBID) capsule 100 mg (100 mg Oral Given 08/04/15 2314)  phenazopyridine (PYRIDIUM) tablet 200 mg (200 mg Oral Given 08/04/15 2314)    Tolerating PO well.  This is a very mild UTI but will treat given the patient's history.  No additional source on exam.  Well appearing.  Based on history and exam patient has been appropriately medically screened and emergency conditions excluded. Patient is stable for discharge at this time.   Follow up provided and strict return precautions given.     Medication List    TAKE these medications        nitrofurantoin (macrocrystal-monohydrate) 100 MG capsule  Commonly known as:  MACROBID  Take 1 capsule (100 mg total) by mouth 2 (two) times daily. X 7 days     phenazopyridine 200 MG tablet  Commonly known as:  PYRIDIUM  Take 1 tablet (200 mg total) by mouth 3 (three) times daily.      ASK your doctor about these medications        acetaminophen 500 MG tablet  Commonly known as:  TYLENOL  Take 1,000 mg by mouth every 6 (six) hours as needed for mild pain.     clotrimazole-betamethasone cream  Commonly known as:  LOTRISONE  Apply 1 application topically 2 (two) times daily as needed (For dermatitis.).     GLUCOSAMINE HCL-MSM PO  Take 1,500 mg by mouth daily.     ibuprofen 600 MG tablet  Commonly known as:  ADVIL,MOTRIN  Take 1 tablet (600 mg total) by  mouth every 6 (six) hours as needed (mild pain).     lisinopril 10 MG tablet  Commonly known as:  PRINIVIL,ZESTRIL  Take 10 mg by mouth daily.     loratadine 10 MG tablet  Commonly known as:  CLARITIN  Take 10 mg by mouth daily.  multivitamin with minerals Tabs tablet  Take 1 tablet by mouth daily.     oxyCODONE-acetaminophen 5-325 MG tablet  Commonly known as:  PERCOCET/ROXICET  Take 1-2 tablets by mouth every 6 (six) hours as needed (moderate to severe pain (when tolerating fluids)).     simvastatin 20 MG tablet  Commonly known as:  ZOCOR  Take 20 mg by mouth daily.     Vitamin D 2000 units tablet  Take 2,000 Units by mouth daily.       Based on history and exam patient has been appropriately medically screened and emergency conditions excluded. Patient is stable for discharge at this time.   Follow up provided and strict return precautions given.    I personally performed the services described in this documentation, which was scribed in my presence. The recorded information has been reviewed and is accurate.      Veatrice Kells, MD 08/05/15 [phone]

## 2015-08-05 ENCOUNTER — Encounter (HOSPITAL_BASED_OUTPATIENT_CLINIC_OR_DEPARTMENT_OTHER): Payer: Self-pay | Admitting: Emergency Medicine

## 2015-08-05 MED ORDER — NITROFURANTOIN MONOHYD MACRO 100 MG PO CAPS: 100.0000 mg | ORAL_CAPSULE | Freq: Two times a day (BID) | ORAL | Status: AC

## 2015-08-05 MED ORDER — PHENAZOPYRIDINE HCL 200 MG PO TABS: 200.0000 mg | ORAL_TABLET | Freq: Three times a day (TID) | ORAL | Status: AC

## 2015-08-05 NOTE — ED Notes (Signed)
Patient tolerated fluids well 

## 2015-08-16 ENCOUNTER — Other Ambulatory Visit: Payer: Self-pay | Admitting: Obstetrics & Gynecology

## 2015-08-16 DIAGNOSIS — Z1231 Encounter for screening mammogram for malignant neoplasm of breast: Secondary | ICD-10-CM

## 2015-08-23 ENCOUNTER — Ambulatory Visit
Admission: RE | Admit: 2015-08-23 | Discharge: 2015-08-23 | Disposition: A | Discharge status: Home / Self Care | Payer: 59 | Source: Ambulatory Visit | Attending: Obstetrics & Gynecology | Admitting: Obstetrics & Gynecology

## 2015-08-23 DIAGNOSIS — Z1231 Encounter for screening mammogram for malignant neoplasm of breast: Secondary | ICD-10-CM | POA: Diagnosis not present

## 2015-08-31 MED FILL — SIMVASTATIN 20 MG TABLET: 20 | 90 days supply | Qty: 90 | Fill #3

## 2015-09-07 DIAGNOSIS — N390 Urinary tract infection, site not specified: Secondary | ICD-10-CM | POA: Diagnosis not present

## 2015-09-28 DIAGNOSIS — Z1211 Encounter for screening for malignant neoplasm of colon: Secondary | ICD-10-CM | POA: Diagnosis not present

## 2015-10-27 MED FILL — LISINOPRIL 10 MG TABLET: 10 | 90 days supply | Qty: 90 | Fill #3

## 2015-11-08 DIAGNOSIS — R739 Hyperglycemia, unspecified: Secondary | ICD-10-CM | POA: Diagnosis not present

## 2015-11-08 DIAGNOSIS — L309 Dermatitis, unspecified: Secondary | ICD-10-CM | POA: Diagnosis not present

## 2015-11-08 DIAGNOSIS — E7800 Pure hypercholesterolemia, unspecified: Secondary | ICD-10-CM | POA: Diagnosis not present

## 2015-11-08 DIAGNOSIS — Z800 Family history of malignant neoplasm of digestive organs: Secondary | ICD-10-CM | POA: Diagnosis not present

## 2015-11-08 DIAGNOSIS — E6609 Other obesity due to excess calories: Secondary | ICD-10-CM | POA: Diagnosis not present

## 2015-11-08 DIAGNOSIS — Z0000 Encounter for general adult medical examination without abnormal findings: Secondary | ICD-10-CM | POA: Diagnosis not present

## 2015-11-08 DIAGNOSIS — I10 Essential (primary) hypertension: Secondary | ICD-10-CM | POA: Diagnosis not present

## 2015-11-16 DIAGNOSIS — Z6834 Body mass index (BMI) 34.0-34.9, adult: Secondary | ICD-10-CM | POA: Diagnosis not present

## 2015-11-16 DIAGNOSIS — Z01419 Encounter for gynecological examination (general) (routine) without abnormal findings: Secondary | ICD-10-CM | POA: Diagnosis not present

## 2015-11-30 DIAGNOSIS — L718 Other rosacea: Secondary | ICD-10-CM | POA: Diagnosis not present

## 2015-11-30 MED FILL — metroNIDAZOLE 0.75 % CREA: 0.75 | 30 days supply | Qty: 45 | Fill #0

## 2015-12-01 MED FILL — SIMVASTATIN 20 MG TABLET: 20 | 90 days supply | Qty: 90 | Fill #0

## 2015-12-02 DIAGNOSIS — N3000 Acute cystitis without hematuria: Secondary | ICD-10-CM | POA: Diagnosis not present

## 2015-12-06 DIAGNOSIS — H8112 Benign paroxysmal vertigo, left ear: Secondary | ICD-10-CM | POA: Diagnosis not present

## 2015-12-06 MED FILL — MECLIZINE 25 MG TABLET: 25 | 30 days supply | Qty: 30 | Fill #0

## 2016-01-04 ENCOUNTER — Encounter: Payer: 59 | Attending: Family Medicine | Admitting: Dietician

## 2016-01-04 DIAGNOSIS — Z713 Dietary counseling and surveillance: Secondary | ICD-10-CM | POA: Diagnosis not present

## 2016-01-04 DIAGNOSIS — R7309 Other abnormal glucose: Secondary | ICD-10-CM

## 2016-01-04 NOTE — Progress Notes (Signed)
  Medical Nutrition Therapy:  Appt start time: [id number] end time:  1730.   Assessment:  Primary concerns today: Robin Wells is here today since her doctor referred her since her Hgb A1c is now at 6.5%. Was in the prediabetes range for a long time. Had 2 surgeries this year so hasn't been able to exercise as much she used to. Has gained weight this year also. Was going to [employer] 1-2 x week and job changed so hasn't been able to go as often. Has been going to [employer] for years.  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 Lives with her son. She does the food shopping and meal preparation at home. Does not miss or skip meals. Eats a lot of dinner meals from restaurant.   Not testing blood sugar and not taking medication. Has lost 6 lbs recently and will have another Hgb A1c in January. Had cut out candy, fries/carbs, and eating more salad and soups. Trying to not have snacks.   Would like to get weight down to 150 lbs. Weight has been between 185-200 lbs in the past few years.  Preferred Learning Style:   No preference indicated   Learning Readiness:   Ready  MEDICATIONS: see list    DIETARY INTAKE:  Usual eating pattern includes 3 meals and 1-2 snacks per day.  Avoided foods include: eggs, beans    24-hr recall:  B ( AM): oatmeal with brown sugar and 1/3 cup blueberries   Snk ( AM): none or peanut butter and honey crackers or granola bar   L ( PM): grapes or apples with soup, salad with chicken  Snk ( PM): none  D ( PM): chicken with baked potato, broccoli or green beans or pork chops or hamburger meat Snk ( PM): fruit or a couple cookies  Beverages: 2-3 cups of skim milk, water, decaf coffee sometimes   Usual physical activity: 1-2 x week [employer]  Estimated energy needs: 1600 calories 180 g carbohydrates 120 g protein 44 g fat  Progress Towards Goal(s):  In progress.   Nutritional Diagnosis:  [place]-2.1 Inpaired nutrition  utilization As related to hx of recent surgeries, limited physical activity, and excess carbohydrate consumption.  As evidenced by Hgb A1c of 6.5%.    Intervention:  Nutrition counseling provided.  Teaching Method Utilized:  Visual Auditory Hands on  Handouts given during visit include:  Living Well with Diabetes  Meal Card  15 g CHO Snacks  Barriers to learning/adherence to lifestyle change: none  Demonstrated degree of understanding via:  Teach Back   Monitoring/Evaluation:  Dietary intake, exercise, and body weight prn.

## 2016-01-04 NOTE — Patient Instructions (Addendum)
Goals:  Limit carbohydrate intake to 30-45 grams carbohydrate/meal  Limit carbohydrate intake to 0-15 grams carbohydrate/snack  Add lean protein foods to meals/snacks  Try [employer] Triple Zero (stevia) or plain yogurt with fruit/sugar for snack  Continue to exercise 2 x week and increase if you can  Try to have 2 cups of milk earlier day or have yogurt instead

## 2016-01-10 DIAGNOSIS — H2513 Age-related nuclear cataract, bilateral: Secondary | ICD-10-CM | POA: Diagnosis not present

## 2016-01-10 DIAGNOSIS — E119 Type 2 diabetes mellitus without complications: Secondary | ICD-10-CM | POA: Diagnosis not present

## 2016-01-24 MED FILL — LISINOPRIL 10 MG TABLET: 10 | 90 days supply | Qty: 90 | Fill #0

## 2016-02-13 MED FILL — GAVILYTE-N SOLUTION: 420 | 2 days supply | Qty: 4000 | Fill #0

## 2016-02-15 DIAGNOSIS — N3000 Acute cystitis without hematuria: Secondary | ICD-10-CM | POA: Diagnosis not present

## 2016-02-15 DIAGNOSIS — R7309 Other abnormal glucose: Secondary | ICD-10-CM | POA: Diagnosis not present

## 2016-02-23 DIAGNOSIS — K635 Polyp of colon: Secondary | ICD-10-CM | POA: Diagnosis not present

## 2016-02-23 DIAGNOSIS — Z1211 Encounter for screening for malignant neoplasm of colon: Secondary | ICD-10-CM | POA: Diagnosis not present

## 2016-02-23 DIAGNOSIS — Z800 Family history of malignant neoplasm of digestive organs: Secondary | ICD-10-CM | POA: Diagnosis not present

## 2016-02-29 MED FILL — SIMVASTATIN 20 MG TABLET: 20 | 90 days supply | Qty: 90 | Fill #1

## 2016-04-20 DIAGNOSIS — N390 Urinary tract infection, site not specified: Secondary | ICD-10-CM | POA: Diagnosis not present

## 2016-04-20 DIAGNOSIS — R300 Dysuria: Secondary | ICD-10-CM | POA: Diagnosis not present

## 2016-04-25 MED FILL — LISINOPRIL 10 MG TABLET: 10 | 90 days supply | Qty: 90 | Fill #0

## 2016-05-15 DIAGNOSIS — E7800 Pure hypercholesterolemia, unspecified: Secondary | ICD-10-CM | POA: Diagnosis not present

## 2016-05-15 DIAGNOSIS — Z6830 Body mass index (BMI) 30.0-30.9, adult: Secondary | ICD-10-CM | POA: Diagnosis not present

## 2016-05-15 DIAGNOSIS — E6609 Other obesity due to excess calories: Secondary | ICD-10-CM | POA: Diagnosis not present

## 2016-05-15 DIAGNOSIS — R739 Hyperglycemia, unspecified: Secondary | ICD-10-CM | POA: Diagnosis not present

## 2016-05-15 DIAGNOSIS — I10 Essential (primary) hypertension: Secondary | ICD-10-CM | POA: Diagnosis not present

## 2016-05-15 DIAGNOSIS — L719 Rosacea, unspecified: Secondary | ICD-10-CM | POA: Diagnosis not present

## 2016-06-04 MED FILL — SIMVASTATIN 20 MG TABLET: 20 | 90 days supply | Qty: 90 | Fill #0

## 2016-06-26 DIAGNOSIS — N393 Stress incontinence (female) (male): Secondary | ICD-10-CM | POA: Diagnosis not present

## 2016-07-26 MED FILL — LISINOPRIL 10 MG TABLET: 10 | 90 days supply | Qty: 90 | Fill #1

## 2016-07-31 ENCOUNTER — Other Ambulatory Visit: Payer: Self-pay | Admitting: Family Medicine

## 2016-07-31 DIAGNOSIS — Z1231 Encounter for screening mammogram for malignant neoplasm of breast: Secondary | ICD-10-CM

## 2016-08-23 ENCOUNTER — Ambulatory Visit
Admission: RE | Admit: 2016-08-23 | Discharge: 2016-08-23 | Disposition: A | Discharge status: Home / Self Care | Payer: 59 | Source: Ambulatory Visit | Attending: Family Medicine | Admitting: Family Medicine

## 2016-08-23 DIAGNOSIS — Z1231 Encounter for screening mammogram for malignant neoplasm of breast: Secondary | ICD-10-CM | POA: Diagnosis not present

## 2016-08-30 MED FILL — SIMVASTATIN 20 MG TABLET: 20 | 90 days supply | Qty: 90 | Fill #1

## 2016-10-23 MED FILL — LISINOPRIL 10 MG TABS: 10 | 90 days supply | Qty: 90 | Fill #0

## 2016-11-15 DIAGNOSIS — E663 Overweight: Secondary | ICD-10-CM | POA: Diagnosis not present

## 2016-11-15 DIAGNOSIS — Z0000 Encounter for general adult medical examination without abnormal findings: Secondary | ICD-10-CM | POA: Diagnosis not present

## 2016-11-15 DIAGNOSIS — Z85858 Personal history of malignant neoplasm of other endocrine glands: Secondary | ICD-10-CM | POA: Diagnosis not present

## 2016-11-15 DIAGNOSIS — I10 Essential (primary) hypertension: Secondary | ICD-10-CM | POA: Diagnosis not present

## 2016-11-15 DIAGNOSIS — R7303 Prediabetes: Secondary | ICD-10-CM | POA: Diagnosis not present

## 2016-11-15 DIAGNOSIS — Z86010 Personal history of colonic polyps: Secondary | ICD-10-CM | POA: Diagnosis not present

## 2016-11-15 DIAGNOSIS — E7800 Pure hypercholesterolemia, unspecified: Secondary | ICD-10-CM | POA: Diagnosis not present

## 2016-11-28 DIAGNOSIS — Z6829 Body mass index (BMI) 29.0-29.9, adult: Secondary | ICD-10-CM | POA: Diagnosis not present

## 2016-11-28 DIAGNOSIS — N390 Urinary tract infection, site not specified: Secondary | ICD-10-CM | POA: Diagnosis not present

## 2016-11-28 DIAGNOSIS — Z01419 Encounter for gynecological examination (general) (routine) without abnormal findings: Secondary | ICD-10-CM | POA: Diagnosis not present

## 2016-11-28 MED FILL — NITROFURANTOIN MONO-MCR 100: 100 | 7 days supply | Qty: 14 | Fill #0

## 2016-11-30 DIAGNOSIS — H1089 Other conjunctivitis: Secondary | ICD-10-CM | POA: Diagnosis not present

## 2016-12-04 MED FILL — SIMVASTATIN 20 MG TABLET: 20 | 90 days supply | Qty: 90 | Fill #0

## 2016-12-27 DIAGNOSIS — R300 Dysuria: Secondary | ICD-10-CM | POA: Diagnosis not present

## 2016-12-27 DIAGNOSIS — R8290 Unspecified abnormal findings in urine: Secondary | ICD-10-CM | POA: Diagnosis not present

## 2016-12-27 MED FILL — CEFUROXIME AXETIL 250 MG TA: 250 | 7 days supply | Qty: 14 | Fill #0

## 2017-01-10 DIAGNOSIS — E119 Type 2 diabetes mellitus without complications: Secondary | ICD-10-CM | POA: Diagnosis not present

## 2017-01-30 MED FILL — LISINOPRIL 10 MG TABS: 10 | 90 days supply | Qty: 90 | Fill #0

## 2017-02-06 DIAGNOSIS — R300 Dysuria: Secondary | ICD-10-CM | POA: Diagnosis not present

## 2017-02-06 MED FILL — NITROFURANTOIN MONO-MCR 100: 100 | 7 days supply | Qty: 14 | Fill #0

## 2017-03-04 MED FILL — SIMVASTATIN 20 MG TABS: 20 | 90 days supply | Qty: 90 | Fill #1

## 2017-04-25 MED FILL — LISINOPRIL 10 MG TABS: 10 | 90 days supply | Qty: 90 | Fill #1

## 2017-05-23 DIAGNOSIS — L304 Erythema intertrigo: Secondary | ICD-10-CM | POA: Diagnosis not present

## 2017-05-23 DIAGNOSIS — I10 Essential (primary) hypertension: Secondary | ICD-10-CM | POA: Diagnosis not present

## 2017-05-23 DIAGNOSIS — E6609 Other obesity due to excess calories: Secondary | ICD-10-CM | POA: Diagnosis not present

## 2017-05-23 DIAGNOSIS — Z6830 Body mass index (BMI) 30.0-30.9, adult: Secondary | ICD-10-CM | POA: Diagnosis not present

## 2017-05-23 DIAGNOSIS — L259 Unspecified contact dermatitis, unspecified cause: Secondary | ICD-10-CM | POA: Diagnosis not present

## 2017-05-23 DIAGNOSIS — E7800 Pure hypercholesterolemia, unspecified: Secondary | ICD-10-CM | POA: Diagnosis not present

## 2017-05-23 DIAGNOSIS — R7303 Prediabetes: Secondary | ICD-10-CM | POA: Diagnosis not present

## 2017-05-23 MED FILL — NYAMYC 100,000 UNITS/GM PWD: 100000 | 30 days supply | Qty: 60 | Fill #0

## 2017-05-23 MED FILL — TRIAMCINOLONE 0.5% CREAM: 0.5 | 30 days supply | Qty: 45 | Fill #0

## 2017-05-26 MED FILL — SIMVASTATIN 20 MG TABS: 20 | 90 days supply | Qty: 90 | Fill #2

## 2017-07-16 ENCOUNTER — Other Ambulatory Visit: Payer: Self-pay | Admitting: Family Medicine

## 2017-07-16 DIAGNOSIS — Z1231 Encounter for screening mammogram for malignant neoplasm of breast: Secondary | ICD-10-CM

## 2017-07-28 MED FILL — LISINOPRIL 10 MG TABLET: 10 | 90 days supply | Qty: 90 | Fill #2

## 2017-08-20 DIAGNOSIS — L218 Other seborrheic dermatitis: Secondary | ICD-10-CM | POA: Diagnosis not present

## 2017-08-20 MED FILL — HYDROCORTISONE 2.5% CREAM: 2.5 | 10 days supply | Qty: 30 | Fill #0

## 2017-08-28 ENCOUNTER — Ambulatory Visit
Admission: RE | Admit: 2017-08-28 | Discharge: 2017-08-28 | Disposition: A | Discharge status: Home / Self Care | Payer: 59 | Source: Ambulatory Visit | Attending: Family Medicine | Admitting: Family Medicine

## 2017-08-28 DIAGNOSIS — Z1231 Encounter for screening mammogram for malignant neoplasm of breast: Secondary | ICD-10-CM

## 2017-08-28 MED FILL — SIMVASTATIN 20 MG TABLET: 20 | 90 days supply | Qty: 90 | Fill #3

## 2017-10-21 MED FILL — LISINOPRIL 10 MG TABLET: 10 | 90 days supply | Qty: 90 | Fill #3

## 2017-11-14 DIAGNOSIS — H109 Unspecified conjunctivitis: Secondary | ICD-10-CM | POA: Diagnosis not present

## 2017-11-18 DIAGNOSIS — E6609 Other obesity due to excess calories: Secondary | ICD-10-CM | POA: Diagnosis not present

## 2017-11-18 DIAGNOSIS — R7303 Prediabetes: Secondary | ICD-10-CM | POA: Diagnosis not present

## 2017-11-18 DIAGNOSIS — E7800 Pure hypercholesterolemia, unspecified: Secondary | ICD-10-CM | POA: Diagnosis not present

## 2017-11-18 DIAGNOSIS — Z0000 Encounter for general adult medical examination without abnormal findings: Secondary | ICD-10-CM | POA: Diagnosis not present

## 2017-11-18 DIAGNOSIS — L309 Dermatitis, unspecified: Secondary | ICD-10-CM | POA: Diagnosis not present

## 2017-11-18 DIAGNOSIS — I10 Essential (primary) hypertension: Secondary | ICD-10-CM | POA: Diagnosis not present

## 2017-11-18 DIAGNOSIS — Z85858 Personal history of malignant neoplasm of other endocrine glands: Secondary | ICD-10-CM | POA: Diagnosis not present

## 2017-11-18 DIAGNOSIS — R300 Dysuria: Secondary | ICD-10-CM | POA: Diagnosis not present

## 2017-11-18 DIAGNOSIS — Z23 Encounter for immunization: Secondary | ICD-10-CM | POA: Diagnosis not present

## 2017-11-24 MED FILL — SIMVASTATIN 20 MG TABLET: 20 | 90 days supply | Qty: 90 | Fill #0

## 2017-12-03 DIAGNOSIS — Z6831 Body mass index (BMI) 31.0-31.9, adult: Secondary | ICD-10-CM | POA: Diagnosis not present

## 2017-12-03 DIAGNOSIS — Z01419 Encounter for gynecological examination (general) (routine) without abnormal findings: Secondary | ICD-10-CM | POA: Diagnosis not present

## 2018-01-03 DIAGNOSIS — J069 Acute upper respiratory infection, unspecified: Secondary | ICD-10-CM | POA: Diagnosis not present

## 2018-01-16 DIAGNOSIS — E119 Type 2 diabetes mellitus without complications: Secondary | ICD-10-CM | POA: Diagnosis not present

## 2018-01-23 MED FILL — LISINOPRIL 10 MG TABLET: 10 | 90 days supply | Qty: 90 | Fill #0

## 2018-02-08 DIAGNOSIS — B379 Candidiasis, unspecified: Secondary | ICD-10-CM | POA: Diagnosis not present

## 2018-02-27 MED FILL — SIMVASTATIN 20 MG TABLET: 20 | 90 days supply | Qty: 90 | Fill #1

## 2018-03-17 DIAGNOSIS — H109 Unspecified conjunctivitis: Secondary | ICD-10-CM | POA: Diagnosis not present

## 2018-03-17 MED FILL — TOBRAMYCIN 0.3 % SOLN: 0.3 | 16 days supply | Qty: 5 | Fill #0

## 2018-04-21 MED FILL — LISINOPRIL 10 MG TABLET: 10 | 90 days supply | Qty: 90 | Fill #1

## 2018-05-26 MED FILL — SIMVASTATIN 20 MG TABLET: 20 | 90 days supply | Qty: 90 | Fill #2

## 2018-07-20 MED FILL — LISINOPRIL 10 MG TABLET: 10 | 90 days supply | Qty: 90 | Fill #2

## 2018-07-31 ENCOUNTER — Other Ambulatory Visit: Payer: Self-pay | Admitting: Family Medicine

## 2018-07-31 DIAGNOSIS — Z1231 Encounter for screening mammogram for malignant neoplasm of breast: Secondary | ICD-10-CM

## 2018-08-25 MED FILL — SIMVASTATIN 20 MG TABLET: 20 | 90 days supply | Qty: 90 | Fill #3

## 2018-09-16 ENCOUNTER — Ambulatory Visit
Admission: RE | Admit: 2018-09-16 | Discharge: 2018-09-16 | Disposition: A | Discharge status: Home / Self Care | Payer: 59 | Source: Ambulatory Visit | Attending: Family Medicine | Admitting: Family Medicine

## 2018-09-16 ENCOUNTER — Other Ambulatory Visit: Payer: Self-pay

## 2018-09-16 DIAGNOSIS — Z1231 Encounter for screening mammogram for malignant neoplasm of breast: Secondary | ICD-10-CM | POA: Diagnosis not present

## 2018-10-19 MED FILL — LISINOPRIL 10 MG TABS: 10 | 90 days supply | Qty: 90 | Fill #3

## 2018-11-13 DIAGNOSIS — I10 Essential (primary) hypertension: Secondary | ICD-10-CM | POA: Diagnosis not present

## 2018-11-13 MED FILL — LISINOPRIL-HCTZ 10-12.5 MG: 10-12.5 | 30 days supply | Qty: 30 | Fill #0

## 2018-11-26 MED FILL — SIMVASTATIN 20 MG TABLET: 20 | 90 days supply | Qty: 90 | Fill #0

## 2018-12-08 DIAGNOSIS — Z01419 Encounter for gynecological examination (general) (routine) without abnormal findings: Secondary | ICD-10-CM | POA: Diagnosis not present

## 2018-12-11 DIAGNOSIS — I10 Essential (primary) hypertension: Secondary | ICD-10-CM | POA: Diagnosis not present

## 2018-12-11 DIAGNOSIS — R7303 Prediabetes: Secondary | ICD-10-CM | POA: Diagnosis not present

## 2018-12-11 DIAGNOSIS — L309 Dermatitis, unspecified: Secondary | ICD-10-CM | POA: Diagnosis not present

## 2018-12-11 DIAGNOSIS — E7800 Pure hypercholesterolemia, unspecified: Secondary | ICD-10-CM | POA: Diagnosis not present

## 2018-12-12 MED FILL — CLOTRIMAZOLE-BETAMETHASONE: 1-0.05 | 30 days supply | Qty: 30 | Fill #0

## 2018-12-12 MED FILL — LISINOPRIL-HCTZ 10-12.5 MG: 10-12.5 | 90 days supply | Qty: 90 | Fill #0

## 2019-01-20 DIAGNOSIS — E119 Type 2 diabetes mellitus without complications: Secondary | ICD-10-CM | POA: Diagnosis not present

## 2019-01-27 ENCOUNTER — Other Ambulatory Visit: Payer: Self-pay

## 2019-01-27 ENCOUNTER — Encounter: Payer: 59 | Attending: Family Medicine | Admitting: Registered"

## 2019-01-27 ENCOUNTER — Encounter: Payer: Self-pay | Admitting: Registered"

## 2019-01-27 DIAGNOSIS — E119 Type 2 diabetes mellitus without complications: Secondary | ICD-10-CM | POA: Insufficient documentation

## 2019-01-27 NOTE — Patient Instructions (Signed)
Consider increasing your activity, ultimately 3-5x week, 30-45 min. Consider including resistance activity to help with bone strength. Continue with your dietary changes, being mindful to include protein with your meals and snacks. Review the Salt handouts for ideas to keep an eye on sodium intake.

## 2019-01-27 NOTE — Progress Notes (Signed)
Diabetes Self-Management Education  Visit Type: First/Initial  Appt. Start Time: 1100 Appt. End Time: 1210  01/27/2019  [name], identified by name and date of birth, is a 69 y.o. female with a diagnosis of Diabetes: Type 2.   ASSESSMENT  There were no vitals taken for this visit. There is no height or weight on file to calculate BMI.   Pt states she was given the option of starting metformin or addressing her BG with diet and lifestyle. Pt states she doesn't want to have the GI issues that some of her friends have and she has read that people who take metformin have had issues such as loosing limbs.   Pt reports 2 months ago started lisinopril-HZT d/t swelling in feet and ankles, also stopped getting take-out and issue has resolved. Patient had questions about how she can tell if she is limiting her salt intake at the right level.  Pt states when she had DSME (for pre-diabetes) in 2017 she was motivated to get BG under control and she lost 30 lbs (weight watchers). Pt reports she had regular physical activity until had some illness and then COVID hit and has not been active since. Pt state she does not feel safe walking in her neighborhood. Pt also states bursitis in her hip makes walking for extended period of time difficult  In addition to decreased activity pt reports that she started eating too many sweets. Pt states she occasionally has a piece candy, but has cut way back and doesn't keep it in the house.  Pt states she does well for a while but then stops doing some of her healthy behaviors.  Diabetes Self-Management Education - 01/27/19 1108      Visit Information   Visit Type  First/Initial      Initial Visit   Diabetes Type  Type 2    Are you currently following a meal plan?  No    Are you taking your medications as prescribed?  Not on Medications    Date Diagnosed  12/11/18      Health Coping   How would you rate your overall health?  Good      Psychosocial  Assessment   Patient Belief/Attitude about Diabetes  Motivated to manage diabetes    How often do you need to have someone help you when you read instructions, pamphlets, or other written materials from your doctor or pharmacy?  1 - Never    What is the last grade level you completed in school?  college graduate      Complications   Last HgB A1C per patient/outside source  6.5 %    How often do you check your blood sugar?  0 times/day (not testing)    Have you had a dilated eye exam in the past 12 months?  Yes    Have you had a dental exam in the past 12 months?  Yes    Are you checking your feet?  No      Dietary Intake   Breakfast  oatmeal, blueberries, walnuts, 1/4 tsp brown sugar    Snack (morning)  none    Lunch  soup, fruit, sigis yogurt    Snack (afternoon)  cheese stick OR PB crackers    Dinner  [place] chili    Snack (evening)  pretzels & PB    Beverage(s)  water, coffee, skim milk      Exercise   Exercise Type  ADL's    How many days per  week to you exercise?  0    How many minutes per day do you exercise?  0    Total minutes per week of exercise  0      Patient Education   Previous Diabetes Education  Yes (please comment)   2017 at [place] management   Role of diet in the treatment of diabetes and the relationship between the three main macronutrients and blood glucose level;Carbohydrate counting    Physical activity and exercise   Role of exercise on diabetes management, blood pressure control and cardiac health.    Monitoring  Other (comment)   interpreting A1c values     Individualized Goals (developed by patient)   Nutrition  General guidelines for healthy choices and portions discussed    Physical Activity  Exercise 3-5 times per week    Reducing Risk  Other (comment)   continue watching sodium intake     Outcomes   Expected Outcomes  Demonstrated interest in learning. Expect positive outcomes    Future DMSE  3-4 months    Program Status  Completed        Individualized Plan for Diabetes Self-Management Training:   Learning Objective:  Patient will have a greater understanding of diabetes self-management. Patient education plan is to attend individual and/or group sessions per assessed needs and concerns.   Patient Instructions  Consider increasing your activity, ultimately 3-5x week, 30-45 min. Consider including resistance activity to help with bone strength. Continue with your dietary changes, being mindful to include protein with your meals and snacks. Review the Salt handouts for ideas to keep an eye on sodium intake.   Expected Outcomes:  Demonstrated interest in learning. Expect positive outcomes  Education material provided: ADA - How to Thrive: A Guide for Your Journey with Diabetes, A1C conversion sheet and Carbohydrate counting sheet, [place], Get the Facts: sodium and dietary guidelines (CDC)  If problems or questions, patient to contact team via:  Phone  Future DSME appointment: 3-4 months

## 2019-02-04 DIAGNOSIS — H01005 Unspecified blepharitis left lower eyelid: Secondary | ICD-10-CM | POA: Diagnosis not present

## 2019-02-04 DIAGNOSIS — H01002 Unspecified blepharitis right lower eyelid: Secondary | ICD-10-CM | POA: Diagnosis not present

## 2019-02-04 MED FILL — TOBRAMYCIN 0.3 % SOLN: 0.3 | 10 days supply | Qty: 5 | Fill #0

## 2019-02-16 DIAGNOSIS — H40013 Open angle with borderline findings, low risk, bilateral: Secondary | ICD-10-CM | POA: Diagnosis not present

## 2019-02-22 MED FILL — SIMVASTATIN 20 MG TABLET: 20 | 90 days supply | Qty: 90 | Fill #1

## 2019-03-09 MED FILL — LISINOPRIL-HCTZ 10-12.5 MG: 10-12.5 | 90 days supply | Qty: 90 | Fill #1

## 2019-03-26 ENCOUNTER — Ambulatory Visit: Payer: 59 | Attending: Internal Medicine

## 2019-03-26 DIAGNOSIS — Z23 Encounter for immunization: Secondary | ICD-10-CM

## 2019-03-26 NOTE — Progress Notes (Signed)
   Covid-19 Vaccination Clinic  Name:  Robin Wells    MRN: [id number] DOB: 06-20-1950  03/26/2019  Robin Wells was observed post Covid-19 immunization for 15 minutes without incident. She was provided with Vaccine Information Sheet and instruction to access the V-Safe system.   Robin Wells was instructed to call 911 with any severe reactions post vaccine: Marland Kitchen Difficulty breathing  . Swelling of face and throat  . A fast heartbeat  . A bad rash all over body  . Dizziness and weakness

## 2019-04-21 DIAGNOSIS — E1169 Type 2 diabetes mellitus with other specified complication: Secondary | ICD-10-CM | POA: Diagnosis not present

## 2019-04-21 DIAGNOSIS — B349 Viral infection, unspecified: Secondary | ICD-10-CM | POA: Diagnosis not present

## 2019-04-21 DIAGNOSIS — R509 Fever, unspecified: Secondary | ICD-10-CM | POA: Diagnosis not present

## 2019-04-28 ENCOUNTER — Ambulatory Visit: Payer: 59 | Attending: Internal Medicine

## 2019-04-28 ENCOUNTER — Encounter: Payer: 59 | Attending: Family Medicine | Admitting: Registered"

## 2019-04-28 ENCOUNTER — Encounter: Payer: Self-pay | Admitting: Registered"

## 2019-04-28 ENCOUNTER — Other Ambulatory Visit: Payer: Self-pay

## 2019-04-28 DIAGNOSIS — Z23 Encounter for immunization: Secondary | ICD-10-CM

## 2019-04-28 DIAGNOSIS — E119 Type 2 diabetes mellitus without complications: Secondary | ICD-10-CM | POA: Insufficient documentation

## 2019-04-28 NOTE — Progress Notes (Signed)
Employee Visit 2 of 3  Diabetes Self-Management Education  Visit Type: Follow-up  Appt. Start Time: 1400 Appt. End Time: [id number]  04/28/2019  [name], identified by name and date of birth, is a 69 y.o. female with a diagnosis of Diabetes: Type 2.   ASSESSMENT  There were no vitals taken for this visit. There is no height or weight on file to calculate BMI.   Patient states she continues to avoid sodium and sweets. Pt reports her clothes are fitting looser, has not weighed herself, but feels she has lost weight. Pt reports she had updated labs but has not been notified of her A1c results.  Pt reports her sister has reduced her A1c and has started eating more fish. Patient states she likes fish but has trouble cooking it, knowing when it's done.   Patient's diet is about the same as 3 months ago and is wanting some ideas for more variety.  Patient states she had not increased her physical activity. Pt states she gets sore if walks much and her work is sedentary and has to not be away from her desk much. Pt reports she has allergies and it is not pleasant to exercise outdoors.   Diabetes Self-Management Education - 04/28/19 1406      Visit Information   Visit Type  Follow-up      Initial Visit   Diabetes Type  Type 2      Complications   How often do you check your blood sugar?  0 times/day (not testing)      Dietary Intake   Breakfast  same as before    Snack (morning)  [place] protein bar or none    Lunch  salad OR diet soup    Snack (afternoon)  low-salt, low fat, cheese    Dinner  meat, starch, veggie OR salad OR [place] chili    Snack (evening)  graham crackers, peanut butter OR nuts    Beverage(s)  water, decaf coffee, skim milk      Exercise   Exercise Type  ADL's    How many days per week to you exercise?  0    How many minutes per day do you exercise?  0    Total minutes per week of exercise  0      Patient Education   Physical activity and exercise    Role of exercise on diabetes management, blood pressure control and cardiac health.      Individualized Goals (developed by patient)   Nutrition  General guidelines for healthy choices and portions discussed    Physical Activity  Exercise 3-5 times per week      Patient Self-Evaluation of Goals - Patient rates self as meeting previously set goals (% of time)   Physical Activity  < 25%    Reducing Risk  >75%      Outcomes   Expected Outcomes  Demonstrated interest in learning. Expect positive outcomes    Future DMSE  2 months    Program Status  Completed      Subsequent Visit   Since your last visit have you continued or begun to take your medications as prescribed?  Not on Medications    Since your last visit have you had your blood pressure checked?  No    Since your last visit have you experienced any weight changes?  Loss   clothes are looser   Since your last visit, are you checking your blood glucose at least  once a day?  No       Individualized Plan for Diabetes Self-Management Training:   Learning Objective:  Patient will have a greater understanding of diabetes self-management. Patient education plan is to attend individual and/or group sessions per assessed needs and concerns.   Patient Instructions  Fish is great to include in your diet, you can try the fish chowder recipe Grain bowl ideas to mix up your salad routine Consider doing some more movement at work at your desk    Expected Outcomes:  Demonstrated interest in learning. Expect positive outcomes  Education material provided: workout at work website, Music therapist, Geophysicist/field seismologist of a [employer] ([email or web address] website)  If problems or questions, patient to contact team via:  Phone  Future DSME appointment: 2 months

## 2019-04-28 NOTE — Patient Instructions (Addendum)
Fish is great to include in your diet, you can try the fish chowder recipe Grain bowl ideas to mix up your salad routine Consider doing some more movement at work at your desk

## 2019-04-28 NOTE — Progress Notes (Signed)
   Covid-19 Vaccination Clinic  Name:  Robin Wells    MRN: [id number] DOB: [birth year]  04/28/2019  [name] was observed post Covid-19 immunization for 15 minutes without incident. She was provided with Vaccine Information Sheet and instruction to access the V-Safe system.   [name] was instructed to call 911 with any severe reactions post vaccine: Marland Kitchen Difficulty breathing  . Swelling of face and throat  . A fast heartbeat  . A bad rash all over body  . Dizziness and weakness   Immunizations Administered    Name Date Dose VIS Date Route   Pfizer COVID-19 Vaccine 04/28/2019  4:32 PM 0.3 mL 01/01/2019 Intramuscular   Manufacturer: [place]   Lot: [id number]   Jeff: [id number]

## 2019-05-26 DIAGNOSIS — E1169 Type 2 diabetes mellitus with other specified complication: Secondary | ICD-10-CM | POA: Diagnosis not present

## 2019-05-26 DIAGNOSIS — E7800 Pure hypercholesterolemia, unspecified: Secondary | ICD-10-CM | POA: Diagnosis not present

## 2019-05-26 DIAGNOSIS — I10 Essential (primary) hypertension: Secondary | ICD-10-CM | POA: Diagnosis not present

## 2019-05-26 DIAGNOSIS — Z23 Encounter for immunization: Secondary | ICD-10-CM | POA: Diagnosis not present

## 2019-05-26 DIAGNOSIS — Z6829 Body mass index (BMI) 29.0-29.9, adult: Secondary | ICD-10-CM | POA: Diagnosis not present

## 2019-05-26 DIAGNOSIS — E663 Overweight: Secondary | ICD-10-CM | POA: Diagnosis not present

## 2019-05-26 MED FILL — SIMVASTATIN 20 MG TABLET: 20 | 90 days supply | Qty: 90 | Fill #0

## 2019-07-07 ENCOUNTER — Encounter: Payer: 59 | Attending: Family Medicine | Admitting: Registered"

## 2019-07-07 ENCOUNTER — Other Ambulatory Visit: Payer: Self-pay

## 2019-07-07 DIAGNOSIS — E119 Type 2 diabetes mellitus without complications: Secondary | ICD-10-CM | POA: Insufficient documentation

## 2019-07-07 NOTE — Progress Notes (Signed)
Employee visit #3 of 3  Diabetes Self-Management Education  Visit Type: Follow-up  Appt. Start Time: 1630 Appt. End Time: 1700  07/07/2019  [name], identified by name and date of birth, is a 69 y.o. female with a diagnosis of Diabetes: Type 2.   ASSESSMENT  There were no vitals taken for this visit. There is no height or weight on file to calculate BMI.   Pt states her A1c has come down and feels it is due to weight loss. Pt states she has not yet increased physical activity, but her friend has encouraged her to try joining a gym.  Pt continues to eat low sodium and low sugar/carb foods. Pt tried fish soup recipe, but without the salt did not find it enjoyable.   Diabetes Self-Management Education - 07/07/19 1600      Visit Information   Visit Type Follow-up      Initial Visit   Diabetes Type Type 2      Complications   Last HgB A1C per patient/outside source 6.3 %    How often do you check your blood sugar? 0 times/day (not testing)      Dietary Intake   Breakfast oatmeal, blue[place] walnuts    Snack (morning) [place] protein OR yogurt    Lunch homemade soup, apple    Snack (afternoon) [employer] yogurt    Dinner 2 tacos, salad    Snack (evening) graham crackers, milk    Beverage(s) water, decaf      Exercise   Exercise Type ADL's      Patient Education   Physical activity and exercise  Role of exercise on diabetes management, blood pressure control and cardiac health.    Monitoring Identified appropriate SMBG and/or A1C goals.      Individualized Goals (developed by patient)   Physical Activity --   find an activity that you can do without injurgy     Patient Self-Evaluation of Goals - Patient rates self as meeting previously set goals (% of time)   Nutrition >75%    Reducing Risk >75%      Outcomes   Expected Outcomes Demonstrated interest in learning. Expect positive outcomes    Future DMSE PRN    Program Status Completed      Subsequent  Visit   Since your last visit have you continued or begun to take your medications as prescribed? Not on Medications    Since your last visit have you experienced any weight changes? Loss    Weight Loss (lbs) 19   This year   Since your last visit, are you checking your blood glucose at least once a day? No           Individualized Plan for Diabetes Self-Management Training:   Learning Objective:  Patient will have a greater understanding of diabetes self-management. Patient education plan is to attend individual and/or group sessions per assessed needs and concerns.    Patient Instructions  Continue with your plan to start more activity at the gym. [email or web address] for recipes   Expected Outcomes:  Demonstrated interest in learning. Expect positive outcomes  Education material provided: none  If problems or questions, patient to contact team via:  Phone  Future DSME appointment: PRN

## 2019-07-07 NOTE — Patient Instructions (Addendum)
Continue with your plan to start more activity at the gym. [email or web address] for recipes

## 2019-07-23 MED FILL — TOBRAMYCIN 0.3 % SOLN: 0.3 | 12 days supply | Qty: 5 | Fill #1

## 2019-08-04 DIAGNOSIS — Z0000 Encounter for general adult medical examination without abnormal findings: Secondary | ICD-10-CM | POA: Diagnosis not present

## 2019-08-04 DIAGNOSIS — E663 Overweight: Secondary | ICD-10-CM | POA: Diagnosis not present

## 2019-08-04 DIAGNOSIS — Z23 Encounter for immunization: Secondary | ICD-10-CM | POA: Diagnosis not present

## 2019-08-04 DIAGNOSIS — I10 Essential (primary) hypertension: Secondary | ICD-10-CM | POA: Diagnosis not present

## 2019-08-04 DIAGNOSIS — E2839 Other primary ovarian failure: Secondary | ICD-10-CM | POA: Diagnosis not present

## 2019-08-04 DIAGNOSIS — E7800 Pure hypercholesterolemia, unspecified: Secondary | ICD-10-CM | POA: Diagnosis not present

## 2019-08-04 DIAGNOSIS — E1169 Type 2 diabetes mellitus with other specified complication: Secondary | ICD-10-CM | POA: Diagnosis not present

## 2019-08-05 ENCOUNTER — Other Ambulatory Visit: Payer: Self-pay | Admitting: Family Medicine

## 2019-08-05 DIAGNOSIS — Z1231 Encounter for screening mammogram for malignant neoplasm of breast: Secondary | ICD-10-CM

## 2019-08-05 DIAGNOSIS — E2839 Other primary ovarian failure: Secondary | ICD-10-CM

## 2019-08-24 ENCOUNTER — Other Ambulatory Visit (HOSPITAL_COMMUNITY): Payer: Self-pay | Admitting: Family Medicine

## 2019-08-24 MED FILL — SIMVASTATIN 20 MG TABLET: 20 | 90 days supply | Qty: 90 | Fill #0

## 2019-08-27 DIAGNOSIS — R399 Unspecified symptoms and signs involving the genitourinary system: Secondary | ICD-10-CM | POA: Diagnosis not present

## 2019-09-03 MED FILL — LISINOPRIL-HCTZ 10-12.5 MG: 10-12.5 | 90 days supply | Qty: 90 | Fill #3

## 2019-10-13 DIAGNOSIS — H40013 Open angle with borderline findings, low risk, bilateral: Secondary | ICD-10-CM | POA: Diagnosis not present

## 2019-10-14 ENCOUNTER — Ambulatory Visit
Admission: RE | Admit: 2019-10-14 | Discharge: 2019-10-14 | Disposition: A | Discharge status: Home / Self Care | Payer: 59 | Source: Ambulatory Visit | Attending: Family Medicine | Admitting: Family Medicine

## 2019-10-14 ENCOUNTER — Other Ambulatory Visit: Payer: Self-pay

## 2019-10-14 DIAGNOSIS — Z1231 Encounter for screening mammogram for malignant neoplasm of breast: Secondary | ICD-10-CM

## 2019-10-14 DIAGNOSIS — Z780 Asymptomatic menopausal state: Secondary | ICD-10-CM | POA: Diagnosis not present

## 2019-10-14 DIAGNOSIS — E2839 Other primary ovarian failure: Secondary | ICD-10-CM

## 2019-11-18 DIAGNOSIS — L219 Seborrheic dermatitis, unspecified: Secondary | ICD-10-CM | POA: Diagnosis not present

## 2019-11-18 DIAGNOSIS — L259 Unspecified contact dermatitis, unspecified cause: Secondary | ICD-10-CM | POA: Diagnosis not present

## 2019-11-25 MED FILL — SIMVASTATIN 20 MG TABLET: 20 | 90 days supply | Qty: 90 | Fill #1

## 2019-12-01 ENCOUNTER — Other Ambulatory Visit (HOSPITAL_COMMUNITY): Payer: Self-pay | Admitting: Family Medicine

## 2019-12-01 DIAGNOSIS — R7303 Prediabetes: Secondary | ICD-10-CM | POA: Diagnosis not present

## 2019-12-01 DIAGNOSIS — I10 Essential (primary) hypertension: Secondary | ICD-10-CM | POA: Diagnosis not present

## 2019-12-01 DIAGNOSIS — L309 Dermatitis, unspecified: Secondary | ICD-10-CM | POA: Diagnosis not present

## 2019-12-01 DIAGNOSIS — E663 Overweight: Secondary | ICD-10-CM | POA: Diagnosis not present

## 2019-12-01 DIAGNOSIS — Z6828 Body mass index (BMI) 28.0-28.9, adult: Secondary | ICD-10-CM | POA: Diagnosis not present

## 2019-12-01 DIAGNOSIS — E7800 Pure hypercholesterolemia, unspecified: Secondary | ICD-10-CM | POA: Diagnosis not present

## 2019-12-01 MED FILL — LISINOPRIL-HCTZ 10-12.5 MG: 10-12.5 | 90 days supply | Qty: 90 | Fill #0

## 2019-12-13 DIAGNOSIS — L309 Dermatitis, unspecified: Secondary | ICD-10-CM | POA: Diagnosis not present

## 2019-12-27 DIAGNOSIS — Z6829 Body mass index (BMI) 29.0-29.9, adult: Secondary | ICD-10-CM | POA: Diagnosis not present

## 2019-12-27 DIAGNOSIS — Z1272 Encounter for screening for malignant neoplasm of vagina: Secondary | ICD-10-CM | POA: Diagnosis not present

## 2019-12-27 DIAGNOSIS — Z124 Encounter for screening for malignant neoplasm of cervix: Secondary | ICD-10-CM | POA: Diagnosis not present

## 2019-12-27 DIAGNOSIS — Z90710 Acquired absence of both cervix and uterus: Secondary | ICD-10-CM | POA: Diagnosis not present

## 2019-12-29 ENCOUNTER — Other Ambulatory Visit (HOSPITAL_COMMUNITY): Payer: Self-pay | Admitting: Family Medicine

## 2019-12-29 DIAGNOSIS — L309 Dermatitis, unspecified: Secondary | ICD-10-CM | POA: Diagnosis not present

## 2019-12-29 MED FILL — predniSONE 20 MG TABS: 20 | 10 days supply | Qty: 15 | Fill #0

## 2020-01-26 DIAGNOSIS — E119 Type 2 diabetes mellitus without complications: Secondary | ICD-10-CM | POA: Diagnosis not present

## 2020-02-22 ENCOUNTER — Other Ambulatory Visit (HOSPITAL_COMMUNITY): Payer: Self-pay | Admitting: Dermatology

## 2020-02-22 DIAGNOSIS — L821 Other seborrheic keratosis: Secondary | ICD-10-CM | POA: Diagnosis not present

## 2020-02-22 DIAGNOSIS — L308 Other specified dermatitis: Secondary | ICD-10-CM | POA: Diagnosis not present

## 2020-02-22 DIAGNOSIS — D1801 Hemangioma of skin and subcutaneous tissue: Secondary | ICD-10-CM | POA: Diagnosis not present

## 2020-02-22 DIAGNOSIS — L218 Other seborrheic dermatitis: Secondary | ICD-10-CM | POA: Diagnosis not present

## 2020-02-22 DIAGNOSIS — D2262 Melanocytic nevi of left upper limb, including shoulder: Secondary | ICD-10-CM | POA: Diagnosis not present

## 2020-02-22 DIAGNOSIS — L2089 Other atopic dermatitis: Secondary | ICD-10-CM | POA: Diagnosis not present

## 2020-02-22 MED FILL — BETAMETHASONE DP AUG 0.05%: 0.05 | 14 days supply | Qty: 50 | Fill #0

## 2020-02-23 MED FILL — SIMVASTATIN 20 MG TABLET: 20 | 90 days supply | Qty: 90 | Fill #0

## 2020-03-06 MED FILL — LISINOPRIL-HCTZ 10-12.5 MG: 10-12.5 | 90 days supply | Qty: 90 | Fill #1

## 2020-03-10 MED FILL — BETAMETHASONE DP AUG 0.05%: 0.05 | 14 days supply | Qty: 50 | Fill #1

## 2020-03-16 ENCOUNTER — Other Ambulatory Visit (HOSPITAL_COMMUNITY): Payer: Self-pay | Admitting: Family Medicine

## 2020-03-16 DIAGNOSIS — H1031 Unspecified acute conjunctivitis, right eye: Secondary | ICD-10-CM | POA: Diagnosis not present

## 2020-03-16 MED FILL — TOBRAMYCIN 0.3 % SOLN: 0.3 | 10 days supply | Qty: 5 | Fill #0

## 2020-04-04 ENCOUNTER — Other Ambulatory Visit (HOSPITAL_COMMUNITY): Payer: Self-pay | Admitting: Family Medicine

## 2020-04-04 DIAGNOSIS — L089 Local infection of the skin and subcutaneous tissue, unspecified: Secondary | ICD-10-CM | POA: Diagnosis not present

## 2020-05-22 MED FILL — Simvastatin Tab 20 MG: ORAL | 90 days supply | Qty: 90 | Fill #0 | Status: AC

## 2020-05-23 ENCOUNTER — Other Ambulatory Visit (HOSPITAL_COMMUNITY): Payer: Self-pay

## 2020-06-02 DIAGNOSIS — R7303 Prediabetes: Secondary | ICD-10-CM | POA: Diagnosis not present

## 2020-06-02 DIAGNOSIS — Z85858 Personal history of malignant neoplasm of other endocrine glands: Secondary | ICD-10-CM | POA: Diagnosis not present

## 2020-06-02 DIAGNOSIS — K5901 Slow transit constipation: Secondary | ICD-10-CM | POA: Diagnosis not present

## 2020-06-02 DIAGNOSIS — E7800 Pure hypercholesterolemia, unspecified: Secondary | ICD-10-CM | POA: Diagnosis not present

## 2020-06-02 DIAGNOSIS — Z0000 Encounter for general adult medical examination without abnormal findings: Secondary | ICD-10-CM | POA: Diagnosis not present

## 2020-06-02 DIAGNOSIS — M7062 Trochanteric bursitis, left hip: Secondary | ICD-10-CM | POA: Diagnosis not present

## 2020-06-02 DIAGNOSIS — E663 Overweight: Secondary | ICD-10-CM | POA: Diagnosis not present

## 2020-06-02 DIAGNOSIS — M7061 Trochanteric bursitis, right hip: Secondary | ICD-10-CM | POA: Diagnosis not present

## 2020-06-02 DIAGNOSIS — Z6829 Body mass index (BMI) 29.0-29.9, adult: Secondary | ICD-10-CM | POA: Diagnosis not present

## 2020-06-02 DIAGNOSIS — L719 Rosacea, unspecified: Secondary | ICD-10-CM | POA: Diagnosis not present

## 2020-06-02 DIAGNOSIS — I10 Essential (primary) hypertension: Secondary | ICD-10-CM | POA: Diagnosis not present

## 2020-06-07 MED FILL — Lisinopril & Hydrochlorothiazide Tab 10-12.5 MG: ORAL | 90 days supply | Qty: 90 | Fill #0 | Status: AC

## 2020-06-08 ENCOUNTER — Other Ambulatory Visit (HOSPITAL_COMMUNITY): Payer: Self-pay

## 2020-08-07 DIAGNOSIS — H40013 Open angle with borderline findings, low risk, bilateral: Secondary | ICD-10-CM | POA: Diagnosis not present

## 2020-08-20 MED FILL — Simvastatin Tab 20 MG: ORAL | 90 days supply | Qty: 90 | Fill #1 | Status: AC

## 2020-08-21 ENCOUNTER — Other Ambulatory Visit (HOSPITAL_COMMUNITY): Payer: Self-pay

## 2020-09-05 MED FILL — Lisinopril & Hydrochlorothiazide Tab 10-12.5 MG: ORAL | 90 days supply | Qty: 90 | Fill #1 | Status: AC

## 2020-09-06 ENCOUNTER — Other Ambulatory Visit (HOSPITAL_COMMUNITY): Payer: Self-pay

## 2020-09-07 ENCOUNTER — Other Ambulatory Visit: Payer: Self-pay | Admitting: Obstetrics & Gynecology

## 2020-09-07 DIAGNOSIS — Z1231 Encounter for screening mammogram for malignant neoplasm of breast: Secondary | ICD-10-CM

## 2020-10-25 ENCOUNTER — Other Ambulatory Visit: Payer: Self-pay

## 2020-10-25 ENCOUNTER — Ambulatory Visit
Admission: RE | Admit: 2020-10-25 | Discharge: 2020-10-25 | Disposition: A | Discharge status: Home / Self Care | Payer: PPO | Source: Ambulatory Visit | Attending: Obstetrics & Gynecology | Admitting: Obstetrics & Gynecology

## 2020-10-25 DIAGNOSIS — Z1231 Encounter for screening mammogram for malignant neoplasm of breast: Secondary | ICD-10-CM

## 2020-11-21 MED FILL — Simvastatin Tab 20 MG: ORAL | 90 days supply | Qty: 90 | Fill #2 | Status: AC

## 2020-11-22 ENCOUNTER — Other Ambulatory Visit (HOSPITAL_COMMUNITY): Payer: Self-pay

## 2020-11-28 DIAGNOSIS — H1132 Conjunctival hemorrhage, left eye: Secondary | ICD-10-CM | POA: Diagnosis not present

## 2020-12-06 ENCOUNTER — Other Ambulatory Visit (HOSPITAL_COMMUNITY): Payer: Self-pay

## 2020-12-06 MED ORDER — LISINOPRIL-HYDROCHLOROTHIAZIDE 10-12.5 MG PO TABS
1.0000 | ORAL_TABLET | Freq: Every day | ORAL | 1 refills | Status: DC
  Filled 2020-12-06: qty 90, 90d supply, fill #0
  Filled 2021-03-02: qty 90, 90d supply, fill #1

## 2020-12-07 ENCOUNTER — Other Ambulatory Visit (HOSPITAL_COMMUNITY): Payer: Self-pay

## 2020-12-07 MED ORDER — SIMVASTATIN 20 MG PO TABS
ORAL_TABLET | ORAL | 1 refills | Status: DC
  Filled 2020-12-07 – 2021-02-21 (×2): qty 90, 90d supply, fill #0
  Filled 2021-05-21: qty 90, 90d supply, fill #1

## 2020-12-12 ENCOUNTER — Other Ambulatory Visit (HOSPITAL_COMMUNITY): Payer: Self-pay

## 2020-12-28 DIAGNOSIS — Z6830 Body mass index (BMI) 30.0-30.9, adult: Secondary | ICD-10-CM | POA: Diagnosis not present

## 2020-12-28 DIAGNOSIS — Z01419 Encounter for gynecological examination (general) (routine) without abnormal findings: Secondary | ICD-10-CM | POA: Diagnosis not present

## 2021-01-30 DIAGNOSIS — H33312 Horseshoe tear of retina without detachment, left eye: Secondary | ICD-10-CM | POA: Diagnosis not present

## 2021-01-30 DIAGNOSIS — H2513 Age-related nuclear cataract, bilateral: Secondary | ICD-10-CM | POA: Diagnosis not present

## 2021-01-30 DIAGNOSIS — H43813 Vitreous degeneration, bilateral: Secondary | ICD-10-CM | POA: Diagnosis not present

## 2021-01-30 DIAGNOSIS — H33332 Multiple defects of retina without detachment, left eye: Secondary | ICD-10-CM | POA: Diagnosis not present

## 2021-02-13 DIAGNOSIS — H33332 Multiple defects of retina without detachment, left eye: Secondary | ICD-10-CM | POA: Diagnosis not present

## 2021-02-13 DIAGNOSIS — H43813 Vitreous degeneration, bilateral: Secondary | ICD-10-CM | POA: Diagnosis not present

## 2021-02-13 DIAGNOSIS — H2513 Age-related nuclear cataract, bilateral: Secondary | ICD-10-CM | POA: Diagnosis not present

## 2021-02-13 DIAGNOSIS — H31092 Other chorioretinal scars, left eye: Secondary | ICD-10-CM | POA: Diagnosis not present

## 2021-02-21 ENCOUNTER — Other Ambulatory Visit (HOSPITAL_COMMUNITY): Payer: Self-pay

## 2021-03-02 ENCOUNTER — Other Ambulatory Visit (HOSPITAL_COMMUNITY): Payer: Self-pay

## 2021-03-07 DIAGNOSIS — H35363 Drusen (degenerative) of macula, bilateral: Secondary | ICD-10-CM | POA: Diagnosis not present

## 2021-03-07 DIAGNOSIS — H2513 Age-related nuclear cataract, bilateral: Secondary | ICD-10-CM | POA: Diagnosis not present

## 2021-03-07 DIAGNOSIS — H43813 Vitreous degeneration, bilateral: Secondary | ICD-10-CM | POA: Diagnosis not present

## 2021-03-07 DIAGNOSIS — H5319 Other subjective visual disturbances: Secondary | ICD-10-CM | POA: Diagnosis not present

## 2021-03-07 DIAGNOSIS — H31092 Other chorioretinal scars, left eye: Secondary | ICD-10-CM | POA: Diagnosis not present

## 2021-03-23 ENCOUNTER — Other Ambulatory Visit (HOSPITAL_COMMUNITY): Payer: Self-pay

## 2021-03-23 DIAGNOSIS — K045 Chronic apical periodontitis: Secondary | ICD-10-CM | POA: Diagnosis not present

## 2021-03-23 MED ORDER — HYDROCODONE-ACETAMINOPHEN 5-325 MG PO TABS
ORAL_TABLET | ORAL | 0 refills | Status: AC
  Filled 2021-03-23: qty 14, 3d supply, fill #0

## 2021-03-23 MED ORDER — CLINDAMYCIN HCL 150 MG PO CAPS
ORAL_CAPSULE | ORAL | 0 refills | Status: AC
  Filled 2021-03-23: qty 28, 7d supply, fill #0

## 2021-05-22 ENCOUNTER — Other Ambulatory Visit (HOSPITAL_COMMUNITY): Payer: Self-pay

## 2021-06-06 DIAGNOSIS — E7800 Pure hypercholesterolemia, unspecified: Secondary | ICD-10-CM | POA: Diagnosis not present

## 2021-06-06 DIAGNOSIS — E6609 Other obesity due to excess calories: Secondary | ICD-10-CM | POA: Diagnosis not present

## 2021-06-06 DIAGNOSIS — M7742 Metatarsalgia, left foot: Secondary | ICD-10-CM | POA: Diagnosis not present

## 2021-06-06 DIAGNOSIS — Z1389 Encounter for screening for other disorder: Secondary | ICD-10-CM | POA: Diagnosis not present

## 2021-06-06 DIAGNOSIS — R7303 Prediabetes: Secondary | ICD-10-CM | POA: Diagnosis not present

## 2021-06-06 DIAGNOSIS — R202 Paresthesia of skin: Secondary | ICD-10-CM | POA: Diagnosis not present

## 2021-06-06 DIAGNOSIS — I10 Essential (primary) hypertension: Secondary | ICD-10-CM | POA: Diagnosis not present

## 2021-06-06 DIAGNOSIS — Z0000 Encounter for general adult medical examination without abnormal findings: Secondary | ICD-10-CM | POA: Diagnosis not present

## 2021-06-06 DIAGNOSIS — Z6831 Body mass index (BMI) 31.0-31.9, adult: Secondary | ICD-10-CM | POA: Diagnosis not present

## 2021-06-07 ENCOUNTER — Other Ambulatory Visit (HOSPITAL_COMMUNITY): Payer: Self-pay

## 2021-06-08 ENCOUNTER — Other Ambulatory Visit (HOSPITAL_COMMUNITY): Payer: Self-pay

## 2021-06-08 MED ORDER — LISINOPRIL-HYDROCHLOROTHIAZIDE 10-12.5 MG PO TABS
1.0000 | ORAL_TABLET | Freq: Every day | ORAL | 3 refills | Status: AC
  Filled 2021-06-08: qty 90, 90d supply, fill #0

## 2021-06-20 ENCOUNTER — Other Ambulatory Visit (HOSPITAL_COMMUNITY): Payer: Self-pay

## 2021-06-20 MED ORDER — LISINOPRIL 20 MG PO TABS
ORAL_TABLET | ORAL | 0 refills | Status: DC
  Filled 2021-06-20: qty 90, 90d supply, fill #0

## 2021-07-31 DIAGNOSIS — I10 Essential (primary) hypertension: Secondary | ICD-10-CM | POA: Diagnosis not present

## 2021-08-20 ENCOUNTER — Other Ambulatory Visit (HOSPITAL_COMMUNITY): Payer: Self-pay

## 2021-08-21 ENCOUNTER — Other Ambulatory Visit (HOSPITAL_COMMUNITY): Payer: Self-pay

## 2021-08-21 MED ORDER — SIMVASTATIN 20 MG PO TABS
ORAL_TABLET | ORAL | 2 refills | Status: DC
  Filled 2021-08-21: qty 90, 90d supply, fill #0
  Filled 2021-11-18: qty 90, 90d supply, fill #1
  Filled 2022-02-10: qty 90, 90d supply, fill #2

## 2021-09-10 ENCOUNTER — Other Ambulatory Visit (HOSPITAL_COMMUNITY): Payer: Self-pay

## 2021-09-10 MED ORDER — LISINOPRIL 20 MG PO TABS
ORAL_TABLET | ORAL | 2 refills | Status: DC
  Filled 2021-09-10: qty 90, 90d supply, fill #0
  Filled 2021-12-07: qty 10, 10d supply, fill #1
  Filled 2021-12-08: qty 80, 80d supply, fill #1
  Filled 2022-03-05: qty 90, 90d supply, fill #2

## 2021-09-18 ENCOUNTER — Other Ambulatory Visit (HOSPITAL_COMMUNITY): Payer: Self-pay

## 2021-09-18 DIAGNOSIS — H2513 Age-related nuclear cataract, bilateral: Secondary | ICD-10-CM | POA: Diagnosis not present

## 2021-09-18 DIAGNOSIS — H353131 Nonexudative age-related macular degeneration, bilateral, early dry stage: Secondary | ICD-10-CM | POA: Diagnosis not present

## 2021-09-18 DIAGNOSIS — H18413 Arcus senilis, bilateral: Secondary | ICD-10-CM | POA: Diagnosis not present

## 2021-09-18 DIAGNOSIS — H2511 Age-related nuclear cataract, right eye: Secondary | ICD-10-CM | POA: Diagnosis not present

## 2021-09-18 DIAGNOSIS — H25013 Cortical age-related cataract, bilateral: Secondary | ICD-10-CM | POA: Diagnosis not present

## 2021-09-18 MED ORDER — MOXIFLOXACIN HCL 0.5 % OP SOLN
OPHTHALMIC | 1 refills | Status: AC
  Filled 2021-09-18: qty 3, 15d supply, fill #0

## 2021-09-18 MED ORDER — PREDNISOLONE ACETATE 1 % OP SUSP
OPHTHALMIC | 1 refills | Status: AC
  Filled 2021-09-18: qty 5, 22d supply, fill #0

## 2021-09-18 MED ORDER — KETOROLAC TROMETHAMINE 0.5 % OP SOLN
OPHTHALMIC | 1 refills | Status: DC
  Filled 2021-09-18: qty 5, 22d supply, fill #0

## 2021-10-03 ENCOUNTER — Other Ambulatory Visit (HOSPITAL_COMMUNITY): Payer: Self-pay

## 2021-10-03 MED ORDER — CLINDAMYCIN HCL 300 MG PO CAPS
300.0000 mg | ORAL_CAPSULE | Freq: Three times a day (TID) | ORAL | 1 refills | Status: AC
  Filled 2021-10-03: qty 21, 7d supply, fill #0

## 2021-10-10 ENCOUNTER — Other Ambulatory Visit: Payer: Self-pay | Admitting: Family Medicine

## 2021-10-10 DIAGNOSIS — Z1231 Encounter for screening mammogram for malignant neoplasm of breast: Secondary | ICD-10-CM

## 2021-11-09 ENCOUNTER — Ambulatory Visit: Payer: PPO

## 2021-11-19 ENCOUNTER — Other Ambulatory Visit (HOSPITAL_COMMUNITY): Payer: Self-pay

## 2021-11-26 ENCOUNTER — Ambulatory Visit
Admission: RE | Admit: 2021-11-26 | Discharge: 2021-11-26 | Disposition: A | Discharge status: Home / Self Care | Payer: PPO | Source: Ambulatory Visit | Attending: Family Medicine | Admitting: Family Medicine

## 2021-11-26 DIAGNOSIS — Z1231 Encounter for screening mammogram for malignant neoplasm of breast: Secondary | ICD-10-CM

## 2021-12-08 ENCOUNTER — Other Ambulatory Visit (HOSPITAL_COMMUNITY): Payer: Self-pay

## 2021-12-21 DIAGNOSIS — Z6831 Body mass index (BMI) 31.0-31.9, adult: Secondary | ICD-10-CM | POA: Diagnosis not present

## 2021-12-21 DIAGNOSIS — J208 Acute bronchitis due to other specified organisms: Secondary | ICD-10-CM | POA: Diagnosis not present

## 2021-12-31 ENCOUNTER — Other Ambulatory Visit (HOSPITAL_COMMUNITY): Payer: Self-pay

## 2021-12-31 DIAGNOSIS — J069 Acute upper respiratory infection, unspecified: Secondary | ICD-10-CM | POA: Diagnosis not present

## 2021-12-31 DIAGNOSIS — Z6832 Body mass index (BMI) 32.0-32.9, adult: Secondary | ICD-10-CM | POA: Diagnosis not present

## 2021-12-31 MED ORDER — AZITHROMYCIN 250 MG PO TABS
ORAL_TABLET | ORAL | 0 refills | Status: AC
  Filled 2021-12-31: qty 6, 5d supply, fill #0

## 2022-01-23 DIAGNOSIS — Z6832 Body mass index (BMI) 32.0-32.9, adult: Secondary | ICD-10-CM | POA: Diagnosis not present

## 2022-01-23 DIAGNOSIS — Z01419 Encounter for gynecological examination (general) (routine) without abnormal findings: Secondary | ICD-10-CM | POA: Diagnosis not present

## 2022-01-23 DIAGNOSIS — N952 Postmenopausal atrophic vaginitis: Secondary | ICD-10-CM | POA: Diagnosis not present

## 2022-01-23 DIAGNOSIS — N3281 Overactive bladder: Secondary | ICD-10-CM | POA: Diagnosis not present

## 2022-01-29 DIAGNOSIS — H2513 Age-related nuclear cataract, bilateral: Secondary | ICD-10-CM | POA: Diagnosis not present

## 2022-01-29 DIAGNOSIS — H18413 Arcus senilis, bilateral: Secondary | ICD-10-CM | POA: Diagnosis not present

## 2022-01-29 DIAGNOSIS — H353131 Nonexudative age-related macular degeneration, bilateral, early dry stage: Secondary | ICD-10-CM | POA: Diagnosis not present

## 2022-01-29 DIAGNOSIS — H2511 Age-related nuclear cataract, right eye: Secondary | ICD-10-CM | POA: Diagnosis not present

## 2022-01-29 DIAGNOSIS — H25013 Cortical age-related cataract, bilateral: Secondary | ICD-10-CM | POA: Diagnosis not present

## 2022-03-18 DIAGNOSIS — H2511 Age-related nuclear cataract, right eye: Secondary | ICD-10-CM | POA: Diagnosis not present

## 2022-03-19 ENCOUNTER — Other Ambulatory Visit (HOSPITAL_COMMUNITY): Payer: Self-pay

## 2022-03-19 DIAGNOSIS — H2512 Age-related nuclear cataract, left eye: Secondary | ICD-10-CM | POA: Diagnosis not present

## 2022-03-19 MED ORDER — KETOROLAC TROMETHAMINE 0.5 % OP SOLN
OPHTHALMIC | 1 refills | Status: DC
  Filled 2022-03-19: qty 5, 100d supply, fill #0

## 2022-03-19 MED ORDER — PREDNISOLONE ACETATE 1 % OP SUSP
OPHTHALMIC | 1 refills | Status: AC
  Filled 2022-03-19: qty 5, 25d supply, fill #0

## 2022-03-19 MED ORDER — MOXIFLOXACIN HCL 0.5 % OP SOLN
OPHTHALMIC | 1 refills | Status: AC
  Filled 2022-03-19: qty 6, 30d supply, fill #0

## 2022-03-20 ENCOUNTER — Other Ambulatory Visit (HOSPITAL_COMMUNITY): Payer: Self-pay

## 2022-03-21 ENCOUNTER — Other Ambulatory Visit (HOSPITAL_COMMUNITY): Payer: Self-pay

## 2022-04-01 DIAGNOSIS — H2512 Age-related nuclear cataract, left eye: Secondary | ICD-10-CM | POA: Diagnosis not present

## 2022-04-01 DIAGNOSIS — H2511 Age-related nuclear cataract, right eye: Secondary | ICD-10-CM | POA: Diagnosis not present

## 2022-05-14 ENCOUNTER — Other Ambulatory Visit (HOSPITAL_COMMUNITY): Payer: Self-pay

## 2022-05-15 ENCOUNTER — Other Ambulatory Visit (HOSPITAL_COMMUNITY): Payer: Self-pay

## 2022-05-15 MED ORDER — SIMVASTATIN 20 MG PO TABS
20.0000 mg | ORAL_TABLET | Freq: Every day | ORAL | 1 refills | Status: DC
  Filled 2022-05-15: qty 90, 90d supply, fill #0
  Filled 2022-08-13: qty 90, 90d supply, fill #1

## 2022-06-10 ENCOUNTER — Other Ambulatory Visit (HOSPITAL_COMMUNITY): Payer: Self-pay

## 2022-06-11 DIAGNOSIS — Z0000 Encounter for general adult medical examination without abnormal findings: Secondary | ICD-10-CM | POA: Diagnosis not present

## 2022-06-11 DIAGNOSIS — I10 Essential (primary) hypertension: Secondary | ICD-10-CM | POA: Diagnosis not present

## 2022-06-11 DIAGNOSIS — R7303 Prediabetes: Secondary | ICD-10-CM | POA: Diagnosis not present

## 2022-06-11 DIAGNOSIS — Z6832 Body mass index (BMI) 32.0-32.9, adult: Secondary | ICD-10-CM | POA: Diagnosis not present

## 2022-06-11 DIAGNOSIS — E6609 Other obesity due to excess calories: Secondary | ICD-10-CM | POA: Diagnosis not present

## 2022-06-11 DIAGNOSIS — E7800 Pure hypercholesterolemia, unspecified: Secondary | ICD-10-CM | POA: Diagnosis not present

## 2022-06-13 ENCOUNTER — Other Ambulatory Visit (HOSPITAL_COMMUNITY): Payer: Self-pay

## 2022-06-13 DIAGNOSIS — Z0000 Encounter for general adult medical examination without abnormal findings: Secondary | ICD-10-CM | POA: Diagnosis not present

## 2022-06-13 DIAGNOSIS — Z1331 Encounter for screening for depression: Secondary | ICD-10-CM | POA: Diagnosis not present

## 2022-06-13 DIAGNOSIS — E669 Obesity, unspecified: Secondary | ICD-10-CM | POA: Diagnosis not present

## 2022-06-13 MED ORDER — LISINOPRIL 20 MG PO TABS
20.0000 mg | ORAL_TABLET | Freq: Every day | ORAL | 3 refills | Status: AC
  Filled 2022-06-13: qty 90, 90d supply, fill #0
  Filled 2022-09-04: qty 90, 90d supply, fill #1
  Filled 2022-12-05: qty 90, 90d supply, fill #2
  Filled 2023-03-06: qty 90, 90d supply, fill #3

## 2022-06-13 MED ORDER — LISINOPRIL 20 MG PO TABS
20.0000 mg | ORAL_TABLET | Freq: Every day | ORAL | 1 refills | Status: DC
  Filled 2023-06-09: qty 90, 90d supply, fill #0

## 2022-06-28 ENCOUNTER — Other Ambulatory Visit (HOSPITAL_COMMUNITY): Payer: Self-pay

## 2022-07-15 IMAGING — MG DIGITAL SCREENING BILAT W/ TOMO W/ CAD
8 series · 8 of 24 positions shown · non-contrast
Comparison: Previous exam(s).

ACR Breast Density Category a: The breast tissue is almost entirely
fatty.

CLINICAL DATA: Screening.

EXAM:
DIGITAL SCREENING BILATERAL MAMMOGRAM WITH TOMO AND CAD

[R MLO synth-2D]
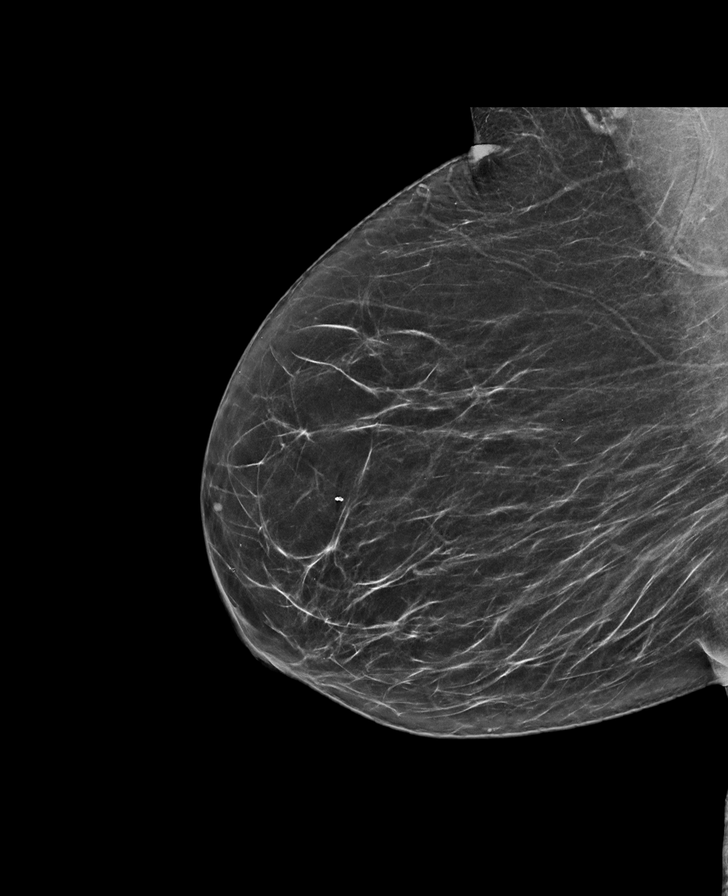

[R CC synth-2D]
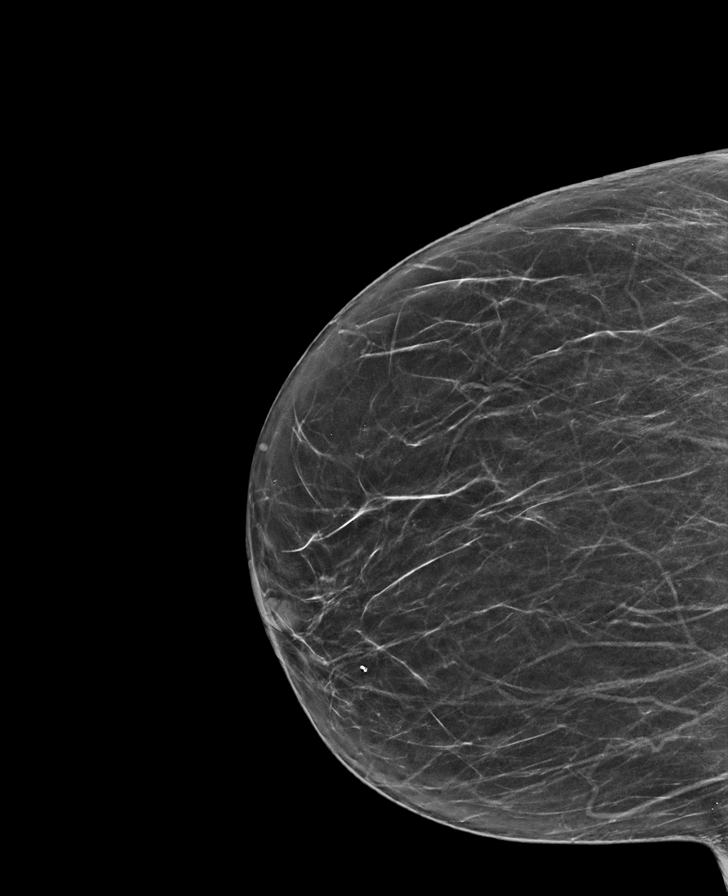

[L MLO synth-2D]
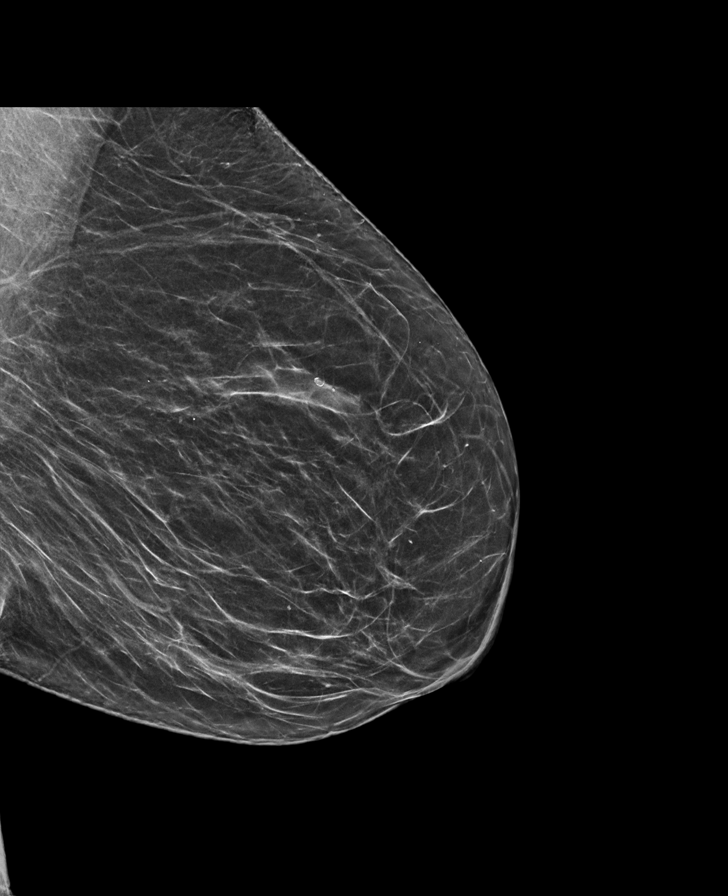

[L CC synth-2D]
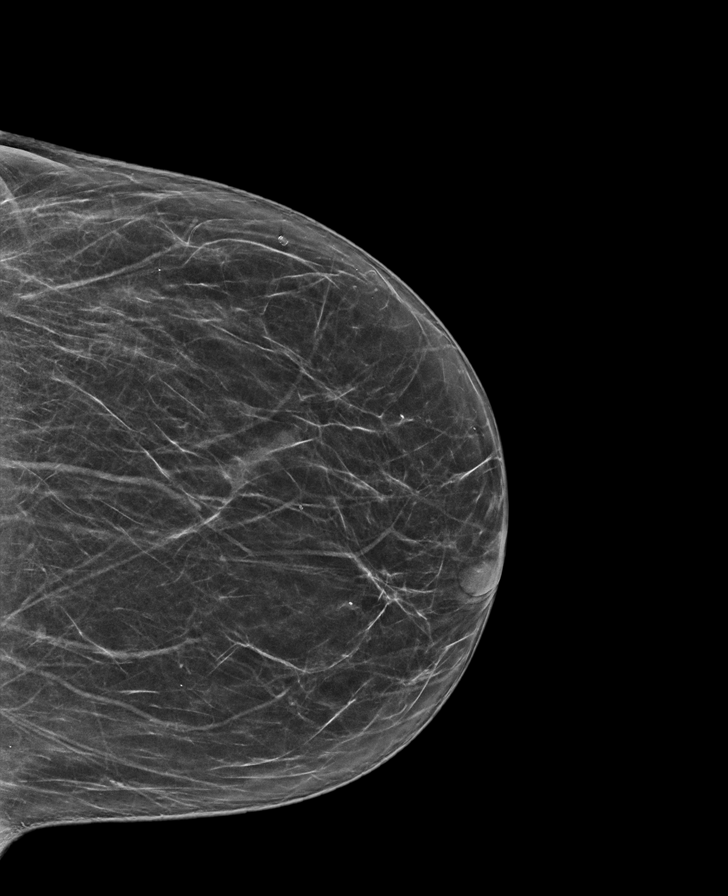

[L MLO tomo · tomo slice 33/65.0]
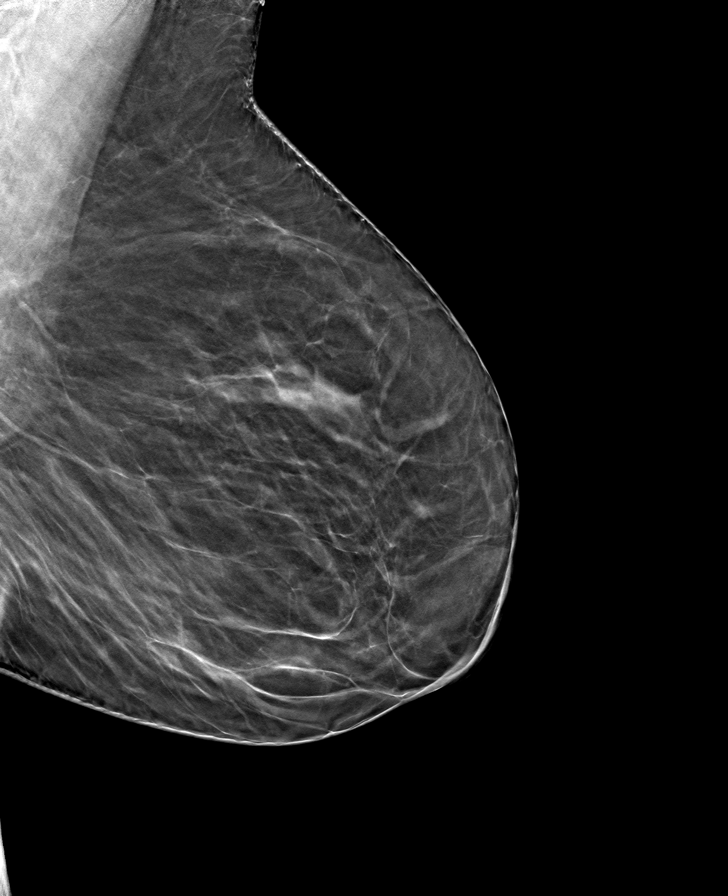

[R MLO tomo · tomo slice 33/66.0]
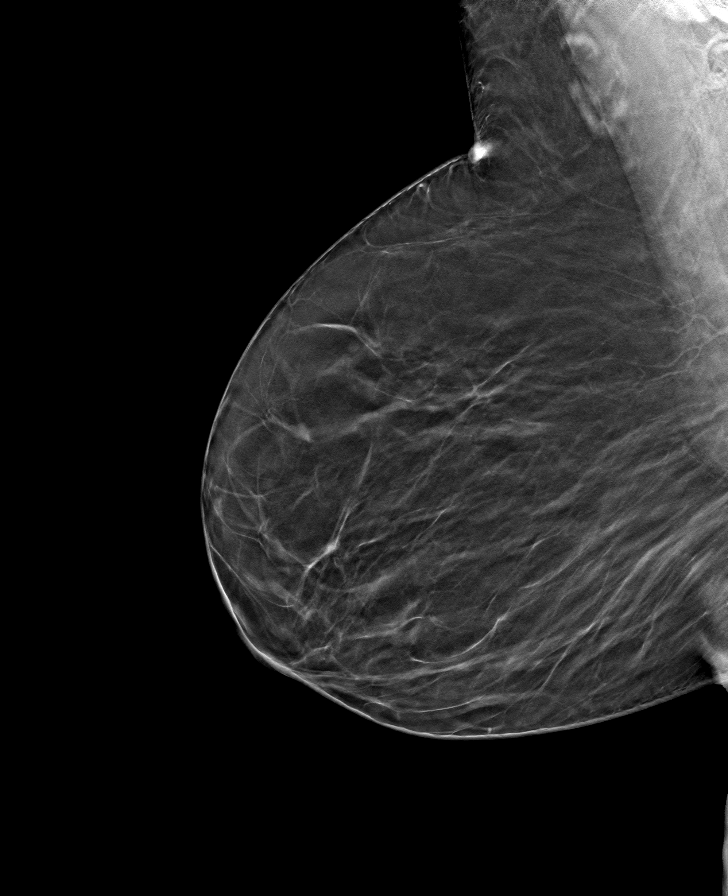

[R CC tomo · tomo slice 29/57.0]
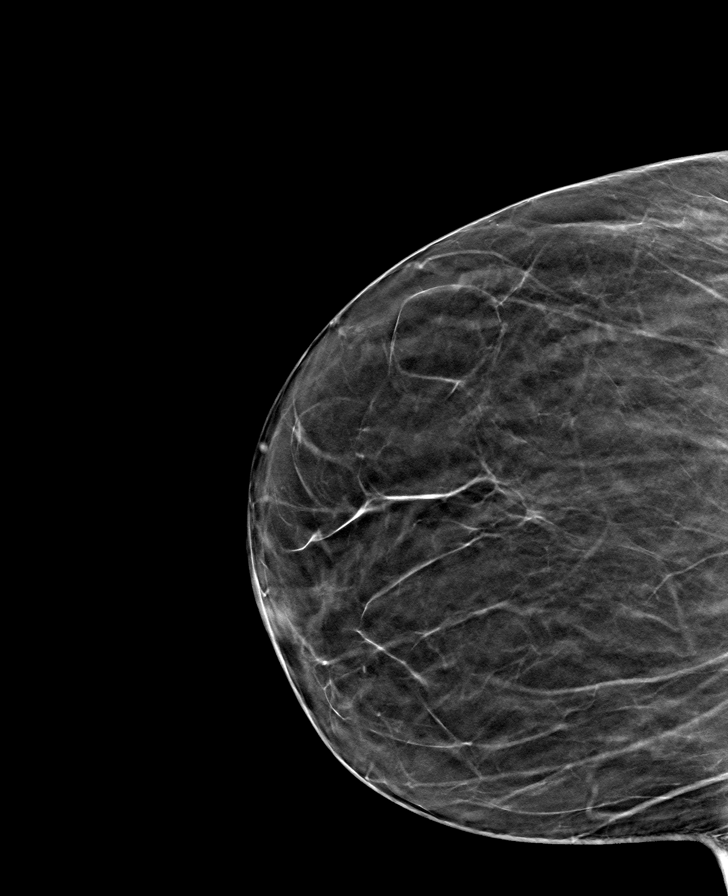

[L CC tomo · tomo slice 32/63.0]
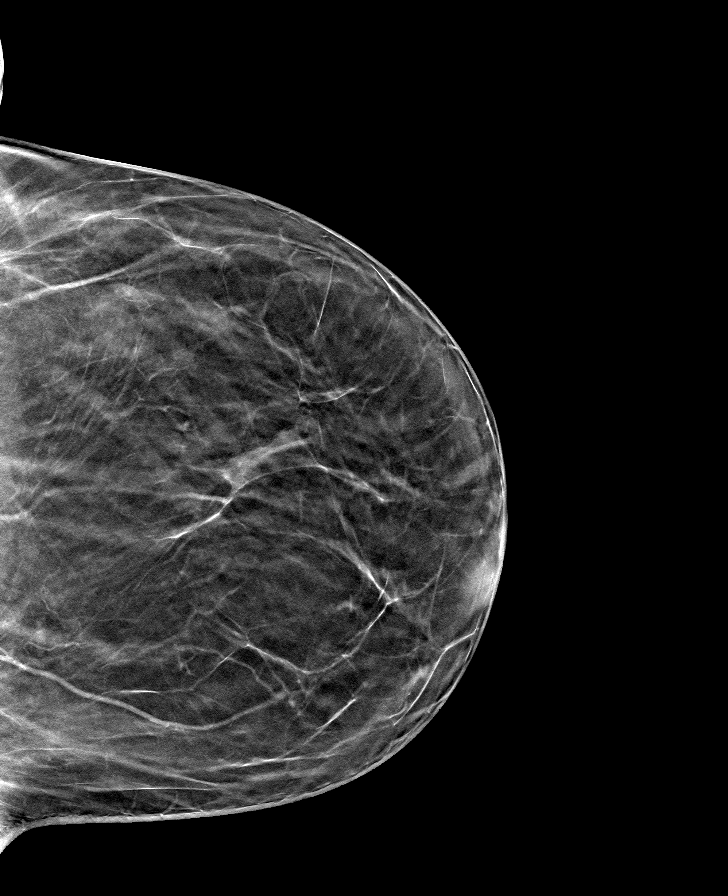

[8 of 24 positions shown; findings below may reference images not displayed]

FINDINGS: There are no findings suspicious for malignancy. Images were
processed with CAD.
IMPRESSION: No mammographic evidence of malignancy. A result letter of this
screening mammogram will be mailed directly to the patient.

RECOMMENDATION:
Screening mammogram in one year. (Code:[id number]-[id number])

BI-RADS CATEGORY  1: Negative.

## 2022-07-30 DIAGNOSIS — E6609 Other obesity due to excess calories: Secondary | ICD-10-CM | POA: Diagnosis not present

## 2022-07-30 DIAGNOSIS — Z6832 Body mass index (BMI) 32.0-32.9, adult: Secondary | ICD-10-CM | POA: Diagnosis not present

## 2022-07-30 DIAGNOSIS — E7800 Pure hypercholesterolemia, unspecified: Secondary | ICD-10-CM | POA: Diagnosis not present

## 2022-07-30 DIAGNOSIS — I10 Essential (primary) hypertension: Secondary | ICD-10-CM | POA: Diagnosis not present

## 2022-07-30 DIAGNOSIS — E1169 Type 2 diabetes mellitus with other specified complication: Secondary | ICD-10-CM | POA: Diagnosis not present

## 2022-08-14 ENCOUNTER — Other Ambulatory Visit (HOSPITAL_COMMUNITY): Payer: Self-pay

## 2022-10-03 ENCOUNTER — Other Ambulatory Visit: Payer: Self-pay | Admitting: Family Medicine

## 2022-10-03 DIAGNOSIS — Z1231 Encounter for screening mammogram for malignant neoplasm of breast: Secondary | ICD-10-CM

## 2022-10-31 DIAGNOSIS — E1169 Type 2 diabetes mellitus with other specified complication: Secondary | ICD-10-CM | POA: Diagnosis not present

## 2022-10-31 DIAGNOSIS — I10 Essential (primary) hypertension: Secondary | ICD-10-CM | POA: Diagnosis not present

## 2022-10-31 DIAGNOSIS — E7800 Pure hypercholesterolemia, unspecified: Secondary | ICD-10-CM | POA: Diagnosis not present

## 2022-10-31 DIAGNOSIS — E6609 Other obesity due to excess calories: Secondary | ICD-10-CM | POA: Diagnosis not present

## 2022-11-05 ENCOUNTER — Other Ambulatory Visit (HOSPITAL_COMMUNITY): Payer: Self-pay

## 2022-11-06 ENCOUNTER — Other Ambulatory Visit (HOSPITAL_COMMUNITY): Payer: Self-pay

## 2022-11-06 MED ORDER — SIMVASTATIN 20 MG PO TABS
20.0000 mg | ORAL_TABLET | Freq: Every day | ORAL | 2 refills | Status: DC
  Filled 2022-11-06: qty 90, 90d supply, fill #0
  Filled 2023-02-05: qty 90, 90d supply, fill #1
  Filled 2023-05-06: qty 90, 90d supply, fill #2

## 2022-11-28 ENCOUNTER — Ambulatory Visit: Payer: PPO

## 2022-12-06 ENCOUNTER — Other Ambulatory Visit (HOSPITAL_COMMUNITY): Payer: Self-pay

## 2022-12-26 ENCOUNTER — Ambulatory Visit
Admission: RE | Admit: 2022-12-26 | Discharge: 2022-12-26 | Disposition: A | Discharge status: Home / Self Care | Payer: PPO | Source: Ambulatory Visit | Attending: Family Medicine | Admitting: Family Medicine

## 2022-12-26 DIAGNOSIS — Z1231 Encounter for screening mammogram for malignant neoplasm of breast: Secondary | ICD-10-CM

## 2023-01-08 ENCOUNTER — Other Ambulatory Visit (HOSPITAL_COMMUNITY): Payer: Self-pay

## 2023-01-08 MED ORDER — FLUCONAZOLE 150 MG PO TABS
150.0000 mg | ORAL_TABLET | Freq: Once | ORAL | 1 refills | Status: AC
  Filled 2023-01-08: qty 2, 3d supply, fill #0

## 2023-01-08 MED ORDER — AMOXICILLIN-POT CLAVULANATE 875-125 MG PO TABS
1.0000 | ORAL_TABLET | Freq: Two times a day (BID) | ORAL | 1 refills | Status: AC
  Filled 2023-01-08: qty 20, 10d supply, fill #0

## 2023-01-10 ENCOUNTER — Other Ambulatory Visit (HOSPITAL_COMMUNITY): Payer: Self-pay

## 2023-02-05 ENCOUNTER — Other Ambulatory Visit (HOSPITAL_COMMUNITY): Payer: Self-pay

## 2023-02-14 ENCOUNTER — Other Ambulatory Visit (HOSPITAL_COMMUNITY): Payer: Self-pay

## 2023-02-14 MED ORDER — HYDROCODONE-ACETAMINOPHEN 5-325 MG PO TABS
1.0000 | ORAL_TABLET | ORAL | 0 refills | Status: AC
  Filled 2023-02-14: qty 14, 3d supply, fill #0

## 2023-02-14 MED ORDER — CLINDAMYCIN HCL 150 MG PO CAPS
150.0000 mg | ORAL_CAPSULE | Freq: Four times a day (QID) | ORAL | 0 refills | Status: AC
  Filled 2023-02-14: qty 28, 7d supply, fill #0

## 2023-03-08 ENCOUNTER — Other Ambulatory Visit (HOSPITAL_COMMUNITY): Payer: Self-pay

## 2023-04-09 DIAGNOSIS — E119 Type 2 diabetes mellitus without complications: Secondary | ICD-10-CM | POA: Diagnosis not present

## 2023-06-09 ENCOUNTER — Other Ambulatory Visit (HOSPITAL_COMMUNITY): Payer: Self-pay

## 2023-06-17 DIAGNOSIS — R7303 Prediabetes: Secondary | ICD-10-CM | POA: Diagnosis not present

## 2023-06-17 DIAGNOSIS — Z6833 Body mass index (BMI) 33.0-33.9, adult: Secondary | ICD-10-CM | POA: Diagnosis not present

## 2023-06-17 DIAGNOSIS — Z1331 Encounter for screening for depression: Secondary | ICD-10-CM | POA: Diagnosis not present

## 2023-06-17 DIAGNOSIS — E7800 Pure hypercholesterolemia, unspecified: Secondary | ICD-10-CM | POA: Diagnosis not present

## 2023-06-17 DIAGNOSIS — L719 Rosacea, unspecified: Secondary | ICD-10-CM | POA: Diagnosis not present

## 2023-06-17 DIAGNOSIS — E6609 Other obesity due to excess calories: Secondary | ICD-10-CM | POA: Diagnosis not present

## 2023-06-17 DIAGNOSIS — I10 Essential (primary) hypertension: Secondary | ICD-10-CM | POA: Diagnosis not present

## 2023-06-17 DIAGNOSIS — Z0000 Encounter for general adult medical examination without abnormal findings: Secondary | ICD-10-CM | POA: Diagnosis not present

## 2023-07-28 ENCOUNTER — Other Ambulatory Visit (HOSPITAL_COMMUNITY): Payer: Self-pay

## 2023-07-28 MED ORDER — SIMVASTATIN 20 MG PO TABS
20.0000 mg | ORAL_TABLET | Freq: Every day | ORAL | 3 refills | Status: AC
  Filled 2023-07-28: qty 90, 90d supply, fill #0
  Filled 2023-10-28: qty 90, 90d supply, fill #1
  Filled 2024-01-29: qty 90, 90d supply, fill #2

## 2023-09-05 ENCOUNTER — Other Ambulatory Visit (HOSPITAL_COMMUNITY): Payer: Self-pay

## 2023-09-06 ENCOUNTER — Other Ambulatory Visit (HOSPITAL_COMMUNITY): Payer: Self-pay

## 2023-09-08 ENCOUNTER — Other Ambulatory Visit (HOSPITAL_COMMUNITY): Payer: Self-pay

## 2023-09-08 ENCOUNTER — Other Ambulatory Visit: Payer: Self-pay

## 2023-09-08 MED ORDER — LISINOPRIL 20 MG PO TABS
20.0000 mg | ORAL_TABLET | Freq: Every day | ORAL | 1 refills | Status: AC
  Filled 2023-09-08: qty 90, 90d supply, fill #0
  Filled 2023-12-03: qty 90, 90d supply, fill #1

## 2023-10-06 DIAGNOSIS — H40013 Open angle with borderline findings, low risk, bilateral: Secondary | ICD-10-CM | POA: Diagnosis not present

## 2023-10-23 ENCOUNTER — Other Ambulatory Visit: Payer: Self-pay | Admitting: Family Medicine

## 2023-10-23 DIAGNOSIS — Z0000 Encounter for general adult medical examination without abnormal findings: Secondary | ICD-10-CM

## 2023-12-23 DIAGNOSIS — E1169 Type 2 diabetes mellitus with other specified complication: Secondary | ICD-10-CM | POA: Diagnosis not present

## 2023-12-29 ENCOUNTER — Ambulatory Visit
Admission: RE | Admit: 2023-12-29 | Discharge: 2023-12-29 | Disposition: A | Source: Ambulatory Visit | Attending: Family Medicine | Admitting: Family Medicine

## 2023-12-29 DIAGNOSIS — Z0000 Encounter for general adult medical examination without abnormal findings: Secondary | ICD-10-CM

## 2024-01-29 ENCOUNTER — Other Ambulatory Visit (HOSPITAL_COMMUNITY): Payer: Self-pay

## 2024-01-29 MED ORDER — NYSTATIN 100000 UNIT/GM EX CREA
1.0000 | TOPICAL_CREAM | Freq: Two times a day (BID) | CUTANEOUS | 0 refills | Status: AC
  Filled 2024-01-29: qty 30, 15d supply, fill #0

## 2024-02-20 ENCOUNTER — Other Ambulatory Visit: Payer: Self-pay

## 2024-02-20 ENCOUNTER — Other Ambulatory Visit (HOSPITAL_COMMUNITY): Payer: Self-pay

## 2024-02-20 MED ORDER — FLUCONAZOLE 150 MG PO TABS
150.0000 mg | ORAL_TABLET | ORAL | 0 refills | Status: AC
  Filled 2024-02-20: qty 2, 6d supply, fill #0

## 2024-02-20 MED ORDER — NYSTATIN 100000 UNIT/GM EX POWD
Freq: Two times a day (BID) | CUTANEOUS | 1 refills | Status: AC
  Filled 2024-02-20: qty 60, 30d supply, fill #0
